# Patient Record
Sex: Male | Born: 1975 | Race: White | Hispanic: No | Marital: Married | Smoking: Former smoker
Health system: Southern US, Community
[De-identification: ages and names within clinical notes are randomized; demographics above are authoritative.]

## PROBLEM LIST (undated history)

## (undated) DIAGNOSIS — E782 Mixed hyperlipidemia: Secondary | ICD-10-CM

## (undated) DIAGNOSIS — I509 Heart failure, unspecified: Secondary | ICD-10-CM

## (undated) DIAGNOSIS — E119 Type 2 diabetes mellitus without complications: Secondary | ICD-10-CM

## (undated) DIAGNOSIS — J45909 Unspecified asthma, uncomplicated: Secondary | ICD-10-CM

## (undated) DIAGNOSIS — M1990 Unspecified osteoarthritis, unspecified site: Secondary | ICD-10-CM

## (undated) DIAGNOSIS — K219 Gastro-esophageal reflux disease without esophagitis: Secondary | ICD-10-CM

## (undated) DIAGNOSIS — G4730 Sleep apnea, unspecified: Secondary | ICD-10-CM

## (undated) DIAGNOSIS — I10 Essential (primary) hypertension: Secondary | ICD-10-CM

## (undated) HISTORY — PX: EUSTACHIAN TUBE DILATION: SHX6770

## (undated) HISTORY — PX: KNEE SURGERY: SHX244

## (undated) HISTORY — PX: REFRACTIVE SURGERY: SHX103

---

## 2000-06-14 ENCOUNTER — Other Ambulatory Visit: Admission: RE | Admit: 2000-06-14 | Discharge: 2000-06-14 | Payer: Self-pay | Admitting: Otolaryngology

## 2000-06-14 ENCOUNTER — Encounter (INDEPENDENT_AMBULATORY_CARE_PROVIDER_SITE_OTHER): Payer: Self-pay | Admitting: Specialist

## 2008-05-18 ENCOUNTER — Emergency Department (HOSPITAL_BASED_OUTPATIENT_CLINIC_OR_DEPARTMENT_OTHER): Admission: EM | Admit: 2008-05-18 | Discharge: 2008-05-19 | Payer: Self-pay | Admitting: Emergency Medicine

## 2016-10-19 ENCOUNTER — Other Ambulatory Visit (HOSPITAL_COMMUNITY): Payer: Self-pay | Admitting: Ophthalmology

## 2016-10-19 ENCOUNTER — Other Ambulatory Visit (HOSPITAL_COMMUNITY): Payer: Self-pay | Admitting: Surgery

## 2016-10-19 DIAGNOSIS — H47012 Ischemic optic neuropathy, left eye: Secondary | ICD-10-CM

## 2016-10-28 ENCOUNTER — Ambulatory Visit (HOSPITAL_COMMUNITY): Payer: BLUE CROSS/BLUE SHIELD

## 2016-11-11 ENCOUNTER — Encounter (HOSPITAL_COMMUNITY): Payer: Self-pay

## 2016-11-11 ENCOUNTER — Ambulatory Visit (HOSPITAL_COMMUNITY)
Admission: RE | Admit: 2016-11-11 | Discharge: 2016-11-11 | Disposition: A | Discharge status: Home / Self Care | Payer: BLUE CROSS/BLUE SHIELD | Source: Ambulatory Visit | Attending: Ophthalmology | Admitting: Ophthalmology

## 2016-11-11 DIAGNOSIS — H47012 Ischemic optic neuropathy, left eye: Secondary | ICD-10-CM | POA: Insufficient documentation

## 2016-11-11 LAB — CREATININE, SERUM: CREATININE: 0.78 mg/dL (ref 0.61–1.24)

## 2016-11-11 MED ORDER — GADOBENATE DIMEGLUMINE 529 MG/ML IV SOLN
20.0000 mL | Freq: Once | INTRAVENOUS | Status: AC | PRN
  Administered 2016-11-11: 20 mL via INTRAVENOUS

## 2019-10-07 DIAGNOSIS — I5022 Chronic systolic (congestive) heart failure: Secondary | ICD-10-CM | POA: Diagnosis not present

## 2019-10-07 DIAGNOSIS — I10 Essential (primary) hypertension: Secondary | ICD-10-CM | POA: Diagnosis not present

## 2019-10-07 DIAGNOSIS — I2510 Atherosclerotic heart disease of native coronary artery without angina pectoris: Secondary | ICD-10-CM | POA: Diagnosis not present

## 2019-10-07 DIAGNOSIS — Z6841 Body Mass Index (BMI) 40.0 and over, adult: Secondary | ICD-10-CM | POA: Diagnosis not present

## 2019-10-18 DIAGNOSIS — E1169 Type 2 diabetes mellitus with other specified complication: Secondary | ICD-10-CM | POA: Diagnosis not present

## 2019-10-18 DIAGNOSIS — I5020 Unspecified systolic (congestive) heart failure: Secondary | ICD-10-CM | POA: Diagnosis not present

## 2019-10-18 DIAGNOSIS — G4733 Obstructive sleep apnea (adult) (pediatric): Secondary | ICD-10-CM | POA: Diagnosis not present

## 2019-10-18 DIAGNOSIS — I428 Other cardiomyopathies: Secondary | ICD-10-CM | POA: Diagnosis not present

## 2019-11-06 DIAGNOSIS — I5022 Chronic systolic (congestive) heart failure: Secondary | ICD-10-CM | POA: Diagnosis not present

## 2019-11-15 DIAGNOSIS — E7800 Pure hypercholesterolemia, unspecified: Secondary | ICD-10-CM | POA: Diagnosis not present

## 2019-11-15 DIAGNOSIS — Z6841 Body Mass Index (BMI) 40.0 and over, adult: Secondary | ICD-10-CM | POA: Diagnosis not present

## 2019-11-15 DIAGNOSIS — I10 Essential (primary) hypertension: Secondary | ICD-10-CM | POA: Diagnosis not present

## 2019-11-15 DIAGNOSIS — E1169 Type 2 diabetes mellitus with other specified complication: Secondary | ICD-10-CM | POA: Diagnosis not present

## 2019-12-13 DIAGNOSIS — I5022 Chronic systolic (congestive) heart failure: Secondary | ICD-10-CM | POA: Diagnosis not present

## 2019-12-16 DIAGNOSIS — I5022 Chronic systolic (congestive) heart failure: Secondary | ICD-10-CM | POA: Diagnosis not present

## 2019-12-19 DIAGNOSIS — E785 Hyperlipidemia, unspecified: Secondary | ICD-10-CM | POA: Diagnosis not present

## 2019-12-19 DIAGNOSIS — I429 Cardiomyopathy, unspecified: Secondary | ICD-10-CM | POA: Diagnosis not present

## 2019-12-19 DIAGNOSIS — E1165 Type 2 diabetes mellitus with hyperglycemia: Secondary | ICD-10-CM | POA: Diagnosis not present

## 2019-12-19 DIAGNOSIS — I2510 Atherosclerotic heart disease of native coronary artery without angina pectoris: Secondary | ICD-10-CM | POA: Diagnosis not present

## 2020-02-13 DIAGNOSIS — E782 Mixed hyperlipidemia: Secondary | ICD-10-CM | POA: Diagnosis not present

## 2020-02-13 DIAGNOSIS — I2510 Atherosclerotic heart disease of native coronary artery without angina pectoris: Secondary | ICD-10-CM | POA: Diagnosis not present

## 2020-02-13 DIAGNOSIS — I5022 Chronic systolic (congestive) heart failure: Secondary | ICD-10-CM | POA: Diagnosis not present

## 2020-02-13 DIAGNOSIS — I429 Cardiomyopathy, unspecified: Secondary | ICD-10-CM | POA: Diagnosis not present

## 2020-02-13 DIAGNOSIS — E785 Hyperlipidemia, unspecified: Secondary | ICD-10-CM | POA: Diagnosis not present

## 2020-02-13 DIAGNOSIS — I10 Essential (primary) hypertension: Secondary | ICD-10-CM | POA: Diagnosis not present

## 2020-02-13 DIAGNOSIS — E1165 Type 2 diabetes mellitus with hyperglycemia: Secondary | ICD-10-CM | POA: Diagnosis not present

## 2020-03-16 DIAGNOSIS — I2510 Atherosclerotic heart disease of native coronary artery without angina pectoris: Secondary | ICD-10-CM | POA: Diagnosis not present

## 2020-03-16 DIAGNOSIS — E785 Hyperlipidemia, unspecified: Secondary | ICD-10-CM | POA: Diagnosis not present

## 2020-03-16 DIAGNOSIS — I10 Essential (primary) hypertension: Secondary | ICD-10-CM | POA: Diagnosis not present

## 2020-03-16 DIAGNOSIS — E1165 Type 2 diabetes mellitus with hyperglycemia: Secondary | ICD-10-CM | POA: Diagnosis not present

## 2020-03-16 DIAGNOSIS — I429 Cardiomyopathy, unspecified: Secondary | ICD-10-CM | POA: Diagnosis not present

## 2020-03-16 DIAGNOSIS — Z6841 Body Mass Index (BMI) 40.0 and over, adult: Secondary | ICD-10-CM | POA: Diagnosis not present

## 2020-03-16 DIAGNOSIS — I5022 Chronic systolic (congestive) heart failure: Secondary | ICD-10-CM | POA: Diagnosis not present

## 2020-04-16 DIAGNOSIS — E785 Hyperlipidemia, unspecified: Secondary | ICD-10-CM | POA: Diagnosis not present

## 2020-04-16 DIAGNOSIS — E1165 Type 2 diabetes mellitus with hyperglycemia: Secondary | ICD-10-CM | POA: Diagnosis not present

## 2020-04-16 DIAGNOSIS — I2510 Atherosclerotic heart disease of native coronary artery without angina pectoris: Secondary | ICD-10-CM | POA: Diagnosis not present

## 2020-04-16 DIAGNOSIS — I10 Essential (primary) hypertension: Secondary | ICD-10-CM | POA: Diagnosis not present

## 2020-04-16 DIAGNOSIS — I429 Cardiomyopathy, unspecified: Secondary | ICD-10-CM | POA: Diagnosis not present

## 2020-04-16 DIAGNOSIS — I5022 Chronic systolic (congestive) heart failure: Secondary | ICD-10-CM | POA: Diagnosis not present

## 2020-06-12 DIAGNOSIS — E1165 Type 2 diabetes mellitus with hyperglycemia: Secondary | ICD-10-CM | POA: Diagnosis not present

## 2020-06-12 DIAGNOSIS — E785 Hyperlipidemia, unspecified: Secondary | ICD-10-CM | POA: Diagnosis not present

## 2020-06-18 DIAGNOSIS — I2510 Atherosclerotic heart disease of native coronary artery without angina pectoris: Secondary | ICD-10-CM | POA: Diagnosis not present

## 2020-06-18 DIAGNOSIS — I429 Cardiomyopathy, unspecified: Secondary | ICD-10-CM | POA: Diagnosis not present

## 2020-06-18 DIAGNOSIS — E1165 Type 2 diabetes mellitus with hyperglycemia: Secondary | ICD-10-CM | POA: Diagnosis not present

## 2020-06-18 DIAGNOSIS — E785 Hyperlipidemia, unspecified: Secondary | ICD-10-CM | POA: Diagnosis not present

## 2020-10-22 DIAGNOSIS — E1165 Type 2 diabetes mellitus with hyperglycemia: Secondary | ICD-10-CM | POA: Diagnosis not present

## 2020-10-29 DIAGNOSIS — I5022 Chronic systolic (congestive) heart failure: Secondary | ICD-10-CM | POA: Diagnosis not present

## 2020-10-29 DIAGNOSIS — E785 Hyperlipidemia, unspecified: Secondary | ICD-10-CM | POA: Diagnosis not present

## 2020-10-29 DIAGNOSIS — I2510 Atherosclerotic heart disease of native coronary artery without angina pectoris: Secondary | ICD-10-CM | POA: Diagnosis not present

## 2020-10-29 DIAGNOSIS — I10 Essential (primary) hypertension: Secondary | ICD-10-CM | POA: Diagnosis not present

## 2020-10-29 DIAGNOSIS — E1165 Type 2 diabetes mellitus with hyperglycemia: Secondary | ICD-10-CM | POA: Diagnosis not present

## 2020-10-29 DIAGNOSIS — I429 Cardiomyopathy, unspecified: Secondary | ICD-10-CM | POA: Diagnosis not present

## 2020-12-25 DIAGNOSIS — M7711 Lateral epicondylitis, right elbow: Secondary | ICD-10-CM | POA: Diagnosis not present

## 2020-12-25 DIAGNOSIS — Z6841 Body Mass Index (BMI) 40.0 and over, adult: Secondary | ICD-10-CM | POA: Diagnosis not present

## 2021-02-17 DIAGNOSIS — E1165 Type 2 diabetes mellitus with hyperglycemia: Secondary | ICD-10-CM | POA: Diagnosis not present

## 2021-02-23 DIAGNOSIS — E1165 Type 2 diabetes mellitus with hyperglycemia: Secondary | ICD-10-CM | POA: Diagnosis not present

## 2021-02-23 DIAGNOSIS — I2510 Atherosclerotic heart disease of native coronary artery without angina pectoris: Secondary | ICD-10-CM | POA: Diagnosis not present

## 2021-02-23 DIAGNOSIS — I429 Cardiomyopathy, unspecified: Secondary | ICD-10-CM | POA: Diagnosis not present

## 2021-02-23 DIAGNOSIS — E785 Hyperlipidemia, unspecified: Secondary | ICD-10-CM | POA: Diagnosis not present

## 2021-06-03 DIAGNOSIS — E119 Type 2 diabetes mellitus without complications: Secondary | ICD-10-CM | POA: Diagnosis not present

## 2021-06-03 DIAGNOSIS — H40033 Anatomical narrow angle, bilateral: Secondary | ICD-10-CM | POA: Diagnosis not present

## 2021-06-09 DIAGNOSIS — G4733 Obstructive sleep apnea (adult) (pediatric): Secondary | ICD-10-CM | POA: Diagnosis not present

## 2021-08-02 ENCOUNTER — Other Ambulatory Visit: Payer: Self-pay

## 2021-08-02 ENCOUNTER — Emergency Department (HOSPITAL_BASED_OUTPATIENT_CLINIC_OR_DEPARTMENT_OTHER)
Admission: EM | Admit: 2021-08-02 | Discharge: 2021-08-02 | Disposition: A | Discharge status: Home / Self Care | Payer: BC Managed Care – PPO | Attending: Emergency Medicine | Admitting: Emergency Medicine

## 2021-08-02 ENCOUNTER — Emergency Department (HOSPITAL_BASED_OUTPATIENT_CLINIC_OR_DEPARTMENT_OTHER): Payer: BC Managed Care – PPO

## 2021-08-02 ENCOUNTER — Encounter (HOSPITAL_BASED_OUTPATIENT_CLINIC_OR_DEPARTMENT_OTHER): Payer: Self-pay | Admitting: Emergency Medicine

## 2021-08-02 DIAGNOSIS — F17210 Nicotine dependence, cigarettes, uncomplicated: Secondary | ICD-10-CM | POA: Insufficient documentation

## 2021-08-02 DIAGNOSIS — R0602 Shortness of breath: Secondary | ICD-10-CM | POA: Diagnosis not present

## 2021-08-02 DIAGNOSIS — R059 Cough, unspecified: Secondary | ICD-10-CM | POA: Diagnosis not present

## 2021-08-02 DIAGNOSIS — J45909 Unspecified asthma, uncomplicated: Secondary | ICD-10-CM | POA: Insufficient documentation

## 2021-08-02 DIAGNOSIS — Z20822 Contact with and (suspected) exposure to covid-19: Secondary | ICD-10-CM | POA: Diagnosis not present

## 2021-08-02 DIAGNOSIS — I517 Cardiomegaly: Secondary | ICD-10-CM | POA: Diagnosis not present

## 2021-08-02 DIAGNOSIS — I509 Heart failure, unspecified: Secondary | ICD-10-CM | POA: Diagnosis not present

## 2021-08-02 DIAGNOSIS — I110 Hypertensive heart disease with heart failure: Secondary | ICD-10-CM | POA: Diagnosis not present

## 2021-08-02 DIAGNOSIS — R600 Localized edema: Secondary | ICD-10-CM | POA: Diagnosis not present

## 2021-08-02 DIAGNOSIS — E119 Type 2 diabetes mellitus without complications: Secondary | ICD-10-CM | POA: Insufficient documentation

## 2021-08-02 DIAGNOSIS — J9811 Atelectasis: Secondary | ICD-10-CM | POA: Diagnosis not present

## 2021-08-02 DIAGNOSIS — R079 Chest pain, unspecified: Secondary | ICD-10-CM | POA: Diagnosis not present

## 2021-08-02 HISTORY — DX: Type 2 diabetes mellitus without complications: E11.9

## 2021-08-02 HISTORY — DX: Heart failure, unspecified: I50.9

## 2021-08-02 HISTORY — DX: Mixed hyperlipidemia: E78.2

## 2021-08-02 HISTORY — DX: Unspecified asthma, uncomplicated: J45.909

## 2021-08-02 HISTORY — DX: Unspecified osteoarthritis, unspecified site: M19.90

## 2021-08-02 HISTORY — DX: Essential (primary) hypertension: I10

## 2021-08-02 LAB — COMPREHENSIVE METABOLIC PANEL
ALT: 33 U/L (ref 0–44)
AST: 14 U/L — ABNORMAL LOW (ref 15–41)
Albumin: 4.5 g/dL (ref 3.5–5.0)
Alkaline Phosphatase: 93 U/L (ref 38–126)
Anion gap: 13 (ref 5–15)
BUN: 11 mg/dL (ref 6–20)
CO2: 24 mmol/L (ref 22–32)
Calcium: 9.2 mg/dL (ref 8.9–10.3)
Chloride: 101 mmol/L (ref 98–111)
Creatinine, Ser: 0.69 mg/dL (ref 0.61–1.24)
GFR, Estimated: >60 mL/min (ref >60)
Glucose, Bld: 264 mg/dL — ABNORMAL HIGH (ref 70–99)
Potassium: 3.5 mmol/L (ref 3.5–5.1)
Sodium: 138 mmol/L (ref 135–145)
Total Bilirubin: 0.8 mg/dL (ref 0.3–1.2)
Total Protein: 7.3 g/dL (ref 6.5–8.1)

## 2021-08-02 LAB — TROPONIN I (HIGH SENSITIVITY)
Troponin I (High Sensitivity): 23 ng/L — ABNORMAL HIGH (ref <18)
Troponin I (High Sensitivity): 23 ng/L — ABNORMAL HIGH (ref <18)

## 2021-08-02 LAB — CBC WITH DIFFERENTIAL/PLATELET
Abs Immature Granulocytes: 0.03 10*3/uL (ref 0.00–0.07)
Basophils Absolute: 0.1 10*3/uL (ref 0.0–0.1)
Basophils Relative: 1 %
Eosinophils Absolute: 0.3 10*3/uL (ref 0.0–0.5)
Eosinophils Relative: 3 %
HCT: 47.2 % (ref 39.0–52.0)
Hemoglobin: 16.8 g/dL (ref 13.0–17.0)
Immature Granulocytes: 0 %
Lymphocytes Relative: 23 %
Lymphs Abs: 1.8 10*3/uL (ref 0.7–4.0)
MCH: 29.9 pg (ref 26.0–34.0)
MCHC: 35.6 g/dL (ref 30.0–36.0)
MCV: 84.1 fL (ref 80.0–100.0)
Monocytes Absolute: 0.5 10*3/uL (ref 0.1–1.0)
Monocytes Relative: 7 %
Neutro Abs: 5 10*3/uL (ref 1.7–7.7)
Neutrophils Relative %: 66 %
Platelets: 283 10*3/uL (ref 150–400)
RBC: 5.61 MIL/uL (ref 4.22–5.81)
RDW: 13 % (ref 11.5–15.5)
WBC: 7.6 10*3/uL (ref 4.0–10.5)
nRBC: 0 % (ref 0.0–0.2)

## 2021-08-02 LAB — RESP PANEL BY RT-PCR (FLU A&B, COVID) ARPGX2
Influenza A by PCR: NEGATIVE
Influenza B by PCR: NEGATIVE
SARS Coronavirus 2 by RT PCR: NEGATIVE

## 2021-08-02 LAB — BRAIN NATRIURETIC PEPTIDE: B Natriuretic Peptide: 338.2 pg/mL — ABNORMAL HIGH (ref 0.0–100.0)

## 2021-08-02 MED ORDER — ALBUTEROL SULFATE HFA 108 (90 BASE) MCG/ACT IN AERS
1.0000 | INHALATION_SPRAY | Freq: Once | RESPIRATORY_TRACT | Status: AC
  Administered 2021-08-02: 1 via RESPIRATORY_TRACT
  Filled 2021-08-02: qty 6.7

## 2021-08-02 MED ORDER — FUROSEMIDE 10 MG/ML IJ SOLN
40.0000 mg | Freq: Once | INTRAMUSCULAR | Status: AC
  Administered 2021-08-02: 40 mg via INTRAVENOUS
  Filled 2021-08-02: qty 4

## 2021-08-02 NOTE — ED Notes (Signed)
X-ray at bedside

## 2021-08-02 NOTE — ED Provider Notes (Signed)
[facility] DEPT Provider Note   CSN: [id number] Arrival date & time: 08/02/21  [id number]     History Chief Complaint  Patient presents with   Shortness of Breath    Richard Doyle is a 45 y.o. male.  History of heart failure here with cough and shortness of breath.  Has been noncompliant with his medications.  Denies any chest pain or fever or chills.  The history is provided by the patient.  Shortness of Breath Severity:  Mild Onset quality:  Gradual Duration:  3 days Timing:  Intermittent Progression:  Waxing and waning Chronicity:  New Context: URI   Relieved by:  Nothing Worsened by:  Nothing Associated symptoms: cough   Associated symptoms: no abdominal pain, no chest pain, no claudication, no diaphoresis, no ear pain, no fever, no headaches, no rash, no sore throat, no sputum production, no syncope, no swollen glands and no vomiting       Past Medical History:  Diagnosis Date   Arthritis    Asthma    Congestive heart failure (CHF) ([place])    Diabetes mellitus without complication ([place])    Hypertension    Mixed hyperlipidemia     There are no problems to display for this patient.       History reviewed. No pertinent family history.  Social History   Tobacco Use   Smoking status: Some Days    Types: Cigarettes   Smokeless tobacco: Current    Home Medications Prior to Admission medications   Not on File    Allergies    Patient has no known allergies.  Review of Systems   Review of Systems  Constitutional:  Negative for chills, diaphoresis and fever.  HENT:  Negative for ear pain and sore throat.   Eyes:  Negative for pain and visual disturbance.  Respiratory:  Positive for cough and shortness of breath. Negative for sputum production.   Cardiovascular:  Positive for leg swelling. Negative for chest pain, palpitations, claudication and syncope.  Gastrointestinal:  Negative for abdominal pain and vomiting.   Genitourinary:  Negative for dysuria and hematuria.  Musculoskeletal:  Negative for arthralgias and back pain.  Skin:  Negative for color change and rash.  Neurological:  Negative for seizures, syncope and headaches.  All other systems reviewed and are negative.  Physical Exam Updated Vital Signs BP (!) 157/104   Pulse 81   Temp 97.9 F (36.6 C) (Oral)   Resp 15   Ht 6\' 2"  (1.88 m)   Wt (!) 142.9 kg   SpO2 95%   BMI 40.44 kg/m   Physical Exam Vitals and nursing note reviewed.  Constitutional:      General: He is not in acute distress.    Appearance: He is well-developed.  HENT:     Head: Normocephalic and atraumatic.     Mouth/Throat:     Mouth: Mucous membranes are moist.  Eyes:     Conjunctiva/sclera: Conjunctivae normal.  Cardiovascular:     Rate and Rhythm: Normal rate and regular rhythm.     Pulses: Normal pulses.     Heart sounds: No murmur heard. Pulmonary:     Effort: Pulmonary effort is normal. No respiratory distress.     Breath sounds: Normal breath sounds. No decreased breath sounds.  Abdominal:     Palpations: Abdomen is soft.     Tenderness: There is no abdominal tenderness.  Musculoskeletal:        General: No swelling.     Cervical back:  Normal range of motion and neck supple.     Right lower leg: Edema (trace) present.     Left lower leg: Edema (trace) present.  Skin:    General: Skin is warm and dry.     Capillary Refill: Capillary refill takes less than 2 seconds.  Neurological:     General: No focal deficit present.     Mental Status: He is alert.  Psychiatric:        Mood and Affect: Mood normal.    ED Results / Procedures / Treatments   Labs (all labs ordered are listed, but only abnormal results are displayed) Labs Reviewed  COMPREHENSIVE METABOLIC PANEL - Abnormal; Notable for the following components:      Result Value   Glucose, Bld 264 (*)    AST 14 (*)    All other components within normal limits  BRAIN NATRIURETIC PEPTIDE  - Abnormal; Notable for the following components:   B Natriuretic Peptide 338.2 (*)    All other components within normal limits  TROPONIN I (HIGH SENSITIVITY) - Abnormal; Notable for the following components:   Troponin I (High Sensitivity) 23 (*)    All other components within normal limits  TROPONIN I (HIGH SENSITIVITY) - Abnormal; Notable for the following components:   Troponin I (High Sensitivity) 23 (*)    All other components within normal limits  RESP PANEL BY RT-PCR (FLU A&B, COVID) [id number]  CBC WITH DIFFERENTIAL/PLATELET    EKG EKG Interpretation  Date/Time:  Monday August 02 2021 08:13:03 EST Ventricular Rate:  81 PR Interval:  202 QRS Duration: 126 QT Interval:  427 QTC Calculation: 496 R Axis:   -20 Text Interpretation: Sinus rhythm Borderline prolonged PR interval Left bundle branch block Baseline wander in lead(s) V1 Confirmed by Lennice Sites ([id number]) on 08/02/2021 8:22:45 AM  Radiology DG Chest Portable 1 View  Result Date: 08/02/2021 CLINICAL DATA:  Shortness of breath, chest pain, cough EXAM: PORTABLE CHEST 1 VIEW COMPARISON:  Portable exam 0832 hours without priors for comparison FINDINGS: Enlargement of cardiac silhouette. Mediastinal contours and pulmonary vascularity normal. Mild RIGHT basilar atelectasis. Questionable retrocardiac LEFT lower lobe infiltrate. Remaining lungs clear. No pleural effusion or pneumothorax. IMPRESSION: Enlargement of cardiac silhouette with RIGHT basilar atelectasis. Questionable retrocardiac LEFT lower lobe infiltrate. Electronically Signed   By: Lavonia Dana M.D.   On: 08/02/2021 08:39    Procedures Procedures   Medications Ordered in ED Medications  furosemide (LASIX) injection 40 mg (40 mg Intravenous Given 08/02/21 [id number])    ED Course  I have reviewed the triage vital signs and the nursing notes.  Pertinent labs & imaging results that were available during my care of the patient were reviewed by me and considered in my  medical decision making (see chart for details).    MDM Rules/Calculators/A&P                           Richard Doyle is here with shortness of breath.  History of heart failure, diabetes, high blood pressure.  Patient with high blood pressure but otherwise normal vitals.  Has been noncompliant with his medications.  Has some mild swelling in his legs.  Overall no respiratory distress and clear breath sounds.  Has had a dry cough.  Suspect volume overload versus viral process.  Seems less likely to be ACS is not having any chest pain.  EKG shows sinus rhythm.  No ischemic changes.  Will check labs to evaluate  for heart failure versus infectious process versus ACS.  BNP mildly elevated but appears to be around his baseline.  Troponin 23x2.  EKG with no ischemic changes.  No chest pain.  No infectious symptoms.  Chest x-ray more consistent with volume overload than infectious process.  No leukocytosis.  COVID and flu test are negative.  Doubt pneumonia.  Overall suspect mild heart failure exacerbation.  He is actually written for Lasix to use as needed.  Was given a dose of IV Lasix here.  We will have him continue home dose for the next 4 to 5 days and have him follow-up with cardiologist.  Discharged in good condition, no respiratory distress.  This chart was dictated using voice recognition software.  Despite best efforts to proofread,  errors can occur which can change the documentation meaning.   Final Clinical Impression(s) / ED Diagnoses Final diagnoses:  Acute heart failure, unspecified heart failure type [facility])    Rx / DC Orders ED Discharge Orders     None        Virgina Norfolk, DO 08/02/21 1028

## 2021-08-02 NOTE — ED Notes (Signed)
ED Provider at bedside. 

## 2021-08-02 NOTE — ED Triage Notes (Signed)
Pt arrives to ED with c/o shortness of breath. This started x3 days ago. Pt reports that he started coughing a couple days before the SOB started. He has hx of CHF and Asthma. Does report that he has been incompliant with medications recently. Unsure if he has gained weight. Associated symptoms include DOE, orthopnea, dry cough, chest pressure.

## 2021-08-02 NOTE — Discharge Instructions (Signed)
Take 40 mg of furosemide/Lasix daily for the next 4 to 5 days and follow-up with  cardiologist.

## 2021-09-13 DIAGNOSIS — R42 Dizziness and giddiness: Secondary | ICD-10-CM | POA: Diagnosis not present

## 2021-09-13 DIAGNOSIS — H6690 Otitis media, unspecified, unspecified ear: Secondary | ICD-10-CM | POA: Diagnosis not present

## 2021-09-30 DIAGNOSIS — Z8669 Personal history of other diseases of the nervous system and sense organs: Secondary | ICD-10-CM | POA: Diagnosis not present

## 2021-09-30 DIAGNOSIS — H9313 Tinnitus, bilateral: Secondary | ICD-10-CM | POA: Diagnosis not present

## 2021-10-01 DIAGNOSIS — H90A32 Mixed conductive and sensorineural hearing loss, unilateral, left ear with restricted hearing on the contralateral side: Secondary | ICD-10-CM | POA: Diagnosis not present

## 2021-10-08 DIAGNOSIS — H90A32 Mixed conductive and sensorineural hearing loss, unilateral, left ear with restricted hearing on the contralateral side: Secondary | ICD-10-CM | POA: Diagnosis not present

## 2021-10-12 ENCOUNTER — Other Ambulatory Visit: Payer: Self-pay

## 2021-10-12 DIAGNOSIS — Z79899 Other long term (current) drug therapy: Secondary | ICD-10-CM | POA: Diagnosis not present

## 2021-10-12 DIAGNOSIS — Z8669 Personal history of other diseases of the nervous system and sense organs: Secondary | ICD-10-CM | POA: Diagnosis not present

## 2021-10-12 DIAGNOSIS — H906 Mixed conductive and sensorineural hearing loss, bilateral: Secondary | ICD-10-CM | POA: Diagnosis not present

## 2021-10-12 DIAGNOSIS — I5042 Chronic combined systolic (congestive) and diastolic (congestive) heart failure: Secondary | ICD-10-CM | POA: Diagnosis not present

## 2021-10-12 DIAGNOSIS — N281 Cyst of kidney, acquired: Secondary | ICD-10-CM | POA: Diagnosis not present

## 2021-10-12 DIAGNOSIS — Z7984 Long term (current) use of oral hypoglycemic drugs: Secondary | ICD-10-CM

## 2021-10-12 DIAGNOSIS — I2510 Atherosclerotic heart disease of native coronary artery without angina pectoris: Secondary | ICD-10-CM | POA: Diagnosis present

## 2021-10-12 DIAGNOSIS — R748 Abnormal levels of other serum enzymes: Secondary | ICD-10-CM | POA: Diagnosis not present

## 2021-10-12 DIAGNOSIS — Z20822 Contact with and (suspected) exposure to covid-19: Secondary | ICD-10-CM | POA: Diagnosis not present

## 2021-10-12 DIAGNOSIS — G4733 Obstructive sleep apnea (adult) (pediatric): Secondary | ICD-10-CM | POA: Diagnosis present

## 2021-10-12 DIAGNOSIS — E782 Mixed hyperlipidemia: Secondary | ICD-10-CM | POA: Diagnosis present

## 2021-10-12 DIAGNOSIS — K811 Chronic cholecystitis: Secondary | ICD-10-CM | POA: Diagnosis present

## 2021-10-12 DIAGNOSIS — R1011 Right upper quadrant pain: Secondary | ICD-10-CM | POA: Diagnosis present

## 2021-10-12 DIAGNOSIS — M1990 Unspecified osteoarthritis, unspecified site: Secondary | ICD-10-CM | POA: Diagnosis present

## 2021-10-12 DIAGNOSIS — Z794 Long term (current) use of insulin: Secondary | ICD-10-CM | POA: Diagnosis not present

## 2021-10-12 DIAGNOSIS — Z9622 Myringotomy tube(s) status: Secondary | ICD-10-CM | POA: Diagnosis not present

## 2021-10-12 DIAGNOSIS — K810 Acute cholecystitis: Secondary | ICD-10-CM | POA: Diagnosis not present

## 2021-10-12 DIAGNOSIS — H6983 Other specified disorders of Eustachian tube, bilateral: Secondary | ICD-10-CM | POA: Diagnosis not present

## 2021-10-12 DIAGNOSIS — J45909 Unspecified asthma, uncomplicated: Secondary | ICD-10-CM | POA: Diagnosis not present

## 2021-10-12 DIAGNOSIS — F17210 Nicotine dependence, cigarettes, uncomplicated: Secondary | ICD-10-CM | POA: Diagnosis not present

## 2021-10-12 DIAGNOSIS — K7689 Other specified diseases of liver: Secondary | ICD-10-CM | POA: Diagnosis not present

## 2021-10-12 DIAGNOSIS — K760 Fatty (change of) liver, not elsewhere classified: Secondary | ICD-10-CM | POA: Diagnosis present

## 2021-10-12 DIAGNOSIS — R945 Abnormal results of liver function studies: Secondary | ICD-10-CM | POA: Diagnosis not present

## 2021-10-12 DIAGNOSIS — I700 Atherosclerosis of aorta: Secondary | ICD-10-CM | POA: Diagnosis not present

## 2021-10-12 DIAGNOSIS — R1013 Epigastric pain: Secondary | ICD-10-CM | POA: Diagnosis not present

## 2021-10-12 DIAGNOSIS — I110 Hypertensive heart disease with heart failure: Secondary | ICD-10-CM | POA: Diagnosis not present

## 2021-10-12 DIAGNOSIS — K828 Other specified diseases of gallbladder: Secondary | ICD-10-CM | POA: Diagnosis present

## 2021-10-12 DIAGNOSIS — E119 Type 2 diabetes mellitus without complications: Secondary | ICD-10-CM | POA: Diagnosis not present

## 2021-10-12 DIAGNOSIS — E876 Hypokalemia: Secondary | ICD-10-CM | POA: Diagnosis not present

## 2021-10-12 DIAGNOSIS — R197 Diarrhea, unspecified: Secondary | ICD-10-CM | POA: Diagnosis present

## 2021-10-12 DIAGNOSIS — R7401 Elevation of levels of liver transaminase levels: Secondary | ICD-10-CM | POA: Diagnosis not present

## 2021-10-12 DIAGNOSIS — H748X1 Other specified disorders of right middle ear and mastoid: Secondary | ICD-10-CM | POA: Diagnosis not present

## 2021-10-12 DIAGNOSIS — R17 Unspecified jaundice: Secondary | ICD-10-CM | POA: Diagnosis not present

## 2021-10-12 DIAGNOSIS — K838 Other specified diseases of biliary tract: Secondary | ICD-10-CM | POA: Diagnosis not present

## 2021-10-12 DIAGNOSIS — R933 Abnormal findings on diagnostic imaging of other parts of digestive tract: Secondary | ICD-10-CM | POA: Diagnosis not present

## 2021-10-12 DIAGNOSIS — R109 Unspecified abdominal pain: Secondary | ICD-10-CM | POA: Diagnosis not present

## 2021-10-12 NOTE — ED Triage Notes (Signed)
Upper abdominal pain radiates to chest starting approximately 2 hours prior to arrival.

## 2021-10-13 ENCOUNTER — Emergency Department (HOSPITAL_BASED_OUTPATIENT_CLINIC_OR_DEPARTMENT_OTHER): Payer: BC Managed Care – PPO

## 2021-10-13 ENCOUNTER — Inpatient Hospital Stay (HOSPITAL_BASED_OUTPATIENT_CLINIC_OR_DEPARTMENT_OTHER)
Admission: EM | Admit: 2021-10-13 | Discharge: 2021-10-16 | DRG: 445 | Disposition: A | Discharge status: Home / Self Care | Payer: BC Managed Care – PPO | Attending: Family Medicine | Admitting: Family Medicine

## 2021-10-13 ENCOUNTER — Observation Stay (HOSPITAL_COMMUNITY): Payer: BC Managed Care – PPO

## 2021-10-13 ENCOUNTER — Encounter (HOSPITAL_BASED_OUTPATIENT_CLINIC_OR_DEPARTMENT_OTHER): Payer: Self-pay

## 2021-10-13 ENCOUNTER — Other Ambulatory Visit: Payer: Self-pay

## 2021-10-13 DIAGNOSIS — Z79899 Other long term (current) drug therapy: Secondary | ICD-10-CM | POA: Diagnosis not present

## 2021-10-13 DIAGNOSIS — R17 Unspecified jaundice: Secondary | ICD-10-CM | POA: Diagnosis not present

## 2021-10-13 DIAGNOSIS — I10 Essential (primary) hypertension: Secondary | ICD-10-CM

## 2021-10-13 DIAGNOSIS — R7401 Elevation of levels of liver transaminase levels: Secondary | ICD-10-CM | POA: Diagnosis not present

## 2021-10-13 DIAGNOSIS — I700 Atherosclerosis of aorta: Secondary | ICD-10-CM | POA: Diagnosis not present

## 2021-10-13 DIAGNOSIS — I2510 Atherosclerotic heart disease of native coronary artery without angina pectoris: Secondary | ICD-10-CM | POA: Diagnosis not present

## 2021-10-13 DIAGNOSIS — E119 Type 2 diabetes mellitus without complications: Secondary | ICD-10-CM | POA: Diagnosis not present

## 2021-10-13 DIAGNOSIS — K760 Fatty (change of) liver, not elsewhere classified: Secondary | ICD-10-CM

## 2021-10-13 DIAGNOSIS — Z7984 Long term (current) use of oral hypoglycemic drugs: Secondary | ICD-10-CM | POA: Diagnosis not present

## 2021-10-13 DIAGNOSIS — R7989 Other specified abnormal findings of blood chemistry: Secondary | ICD-10-CM

## 2021-10-13 DIAGNOSIS — Z794 Long term (current) use of insulin: Secondary | ICD-10-CM | POA: Diagnosis not present

## 2021-10-13 DIAGNOSIS — J45909 Unspecified asthma, uncomplicated: Secondary | ICD-10-CM | POA: Diagnosis not present

## 2021-10-13 DIAGNOSIS — E876 Hypokalemia: Secondary | ICD-10-CM | POA: Diagnosis not present

## 2021-10-13 DIAGNOSIS — Z20822 Contact with and (suspected) exposure to covid-19: Secondary | ICD-10-CM | POA: Diagnosis not present

## 2021-10-13 DIAGNOSIS — R1011 Right upper quadrant pain: Secondary | ICD-10-CM

## 2021-10-13 DIAGNOSIS — E66813 Obesity, class 3: Secondary | ICD-10-CM | POA: Diagnosis present

## 2021-10-13 DIAGNOSIS — K810 Acute cholecystitis: Secondary | ICD-10-CM | POA: Diagnosis not present

## 2021-10-13 DIAGNOSIS — F17210 Nicotine dependence, cigarettes, uncomplicated: Secondary | ICD-10-CM | POA: Diagnosis not present

## 2021-10-13 DIAGNOSIS — K811 Chronic cholecystitis: Secondary | ICD-10-CM | POA: Diagnosis not present

## 2021-10-13 DIAGNOSIS — R197 Diarrhea, unspecified: Secondary | ICD-10-CM | POA: Diagnosis not present

## 2021-10-13 DIAGNOSIS — K7689 Other specified diseases of liver: Secondary | ICD-10-CM | POA: Diagnosis not present

## 2021-10-13 DIAGNOSIS — G4733 Obstructive sleep apnea (adult) (pediatric): Secondary | ICD-10-CM | POA: Diagnosis not present

## 2021-10-13 DIAGNOSIS — E782 Mixed hyperlipidemia: Secondary | ICD-10-CM | POA: Diagnosis not present

## 2021-10-13 DIAGNOSIS — R1013 Epigastric pain: Secondary | ICD-10-CM

## 2021-10-13 DIAGNOSIS — N281 Cyst of kidney, acquired: Secondary | ICD-10-CM | POA: Diagnosis not present

## 2021-10-13 DIAGNOSIS — R109 Unspecified abdominal pain: Secondary | ICD-10-CM | POA: Diagnosis not present

## 2021-10-13 DIAGNOSIS — M1990 Unspecified osteoarthritis, unspecified site: Secondary | ICD-10-CM | POA: Diagnosis not present

## 2021-10-13 DIAGNOSIS — K838 Other specified diseases of biliary tract: Secondary | ICD-10-CM | POA: Diagnosis not present

## 2021-10-13 DIAGNOSIS — K828 Other specified diseases of gallbladder: Secondary | ICD-10-CM | POA: Diagnosis not present

## 2021-10-13 DIAGNOSIS — I110 Hypertensive heart disease with heart failure: Secondary | ICD-10-CM | POA: Diagnosis not present

## 2021-10-13 DIAGNOSIS — I5042 Chronic combined systolic (congestive) and diastolic (congestive) heart failure: Secondary | ICD-10-CM | POA: Diagnosis present

## 2021-10-13 LAB — CBC
HCT: 45.7 % (ref 39.0–52.0)
Hemoglobin: 15.4 g/dL (ref 13.0–17.0)
MCH: 29.3 pg (ref 26.0–34.0)
MCHC: 33.7 g/dL (ref 30.0–36.0)
MCV: 86.9 fL (ref 80.0–100.0)
Platelets: 289 10*3/uL (ref 150–400)
RBC: 5.26 MIL/uL (ref 4.22–5.81)
RDW: 13.9 % (ref 11.5–15.5)
WBC: 6.7 10*3/uL (ref 4.0–10.5)
nRBC: 0 % (ref 0.0–0.2)

## 2021-10-13 LAB — COMPREHENSIVE METABOLIC PANEL
ALT: 497 U/L — ABNORMAL HIGH (ref 0–44)
ALT: 98 U/L — ABNORMAL HIGH (ref 0–44)
AST: 137 U/L — ABNORMAL HIGH (ref 15–41)
AST: 491 U/L — ABNORMAL HIGH (ref 15–41)
Albumin: 3.8 g/dL (ref 3.5–5.0)
Albumin: 4 g/dL (ref 3.5–5.0)
Alkaline Phosphatase: 144 U/L — ABNORMAL HIGH (ref 38–126)
Alkaline Phosphatase: 219 U/L — ABNORMAL HIGH (ref 38–126)
Anion gap: 12 (ref 5–15)
Anion gap: 9 (ref 5–15)
BUN: 16 mg/dL (ref 6–20)
BUN: 17 mg/dL (ref 6–20)
CO2: 25 mmol/L (ref 22–32)
CO2: 26 mmol/L (ref 22–32)
Calcium: 8.3 mg/dL — ABNORMAL LOW (ref 8.9–10.3)
Calcium: 9.3 mg/dL (ref 8.9–10.3)
Chloride: 100 mmol/L (ref 98–111)
Chloride: 100 mmol/L (ref 98–111)
Creatinine, Ser: 0.64 mg/dL (ref 0.61–1.24)
Creatinine, Ser: 0.78 mg/dL (ref 0.61–1.24)
GFR, Estimated: >60 mL/min (ref >60)
GFR, Estimated: >60 mL/min (ref >60)
Glucose, Bld: 249 mg/dL — ABNORMAL HIGH (ref 70–99)
Glucose, Bld: 265 mg/dL — ABNORMAL HIGH (ref 70–99)
Potassium: 3.7 mmol/L (ref 3.5–5.1)
Potassium: 3.8 mmol/L (ref 3.5–5.1)
Sodium: 134 mmol/L — ABNORMAL LOW (ref 135–145)
Sodium: 138 mmol/L (ref 135–145)
Total Bilirubin: 1.3 mg/dL — ABNORMAL HIGH (ref 0.3–1.2)
Total Bilirubin: 2.3 mg/dL — ABNORMAL HIGH (ref 0.3–1.2)
Total Protein: 6.2 g/dL — ABNORMAL LOW (ref 6.5–8.1)
Total Protein: 7.1 g/dL (ref 6.5–8.1)

## 2021-10-13 LAB — CBC WITH DIFFERENTIAL/PLATELET
Abs Immature Granulocytes: 0.07 10*3/uL (ref 0.00–0.07)
Basophils Absolute: 0.1 10*3/uL (ref 0.0–0.1)
Basophils Relative: 1 %
Eosinophils Absolute: 0.2 10*3/uL (ref 0.0–0.5)
Eosinophils Relative: 1 %
HCT: 47.4 % (ref 39.0–52.0)
Hemoglobin: 16.2 g/dL (ref 13.0–17.0)
Immature Granulocytes: 1 %
Lymphocytes Relative: 19 %
Lymphs Abs: 2.1 10*3/uL (ref 0.7–4.0)
MCH: 29.2 pg (ref 26.0–34.0)
MCHC: 34.2 g/dL (ref 30.0–36.0)
MCV: 85.6 fL (ref 80.0–100.0)
Monocytes Absolute: 0.9 10*3/uL (ref 0.1–1.0)
Monocytes Relative: 8 %
Neutro Abs: 7.7 10*3/uL (ref 1.7–7.7)
Neutrophils Relative %: 70 %
Platelets: 319 10*3/uL (ref 150–400)
RBC: 5.54 MIL/uL (ref 4.22–5.81)
RDW: 13.8 % (ref 11.5–15.5)
WBC: 11 10*3/uL — ABNORMAL HIGH (ref 4.0–10.5)
nRBC: 0 % (ref 0.0–0.2)

## 2021-10-13 LAB — URINALYSIS, ROUTINE W REFLEX MICROSCOPIC
Bilirubin Urine: NEGATIVE
Glucose, UA: >1000 mg/dL — AB
Hgb urine dipstick: NEGATIVE
Ketones, ur: NEGATIVE mg/dL
Leukocytes,Ua: NEGATIVE
Nitrite: NEGATIVE
Specific Gravity, Urine: 1.046 — ABNORMAL HIGH (ref 1.005–1.030)
pH: 7 (ref 5.0–8.0)

## 2021-10-13 LAB — RESP PANEL BY RT-PCR (FLU A&B, COVID) ARPGX2
Influenza A by PCR: NEGATIVE
Influenza B by PCR: NEGATIVE
SARS Coronavirus 2 by RT PCR: NEGATIVE

## 2021-10-13 LAB — CBG MONITORING, ED
Glucose-Capillary: 271 mg/dL — ABNORMAL HIGH (ref 70–99)
Glucose-Capillary: 277 mg/dL — ABNORMAL HIGH (ref 70–99)

## 2021-10-13 LAB — MAGNESIUM: Magnesium: 1.8 mg/dL (ref 1.7–2.4)

## 2021-10-13 LAB — HEPATITIS PANEL, ACUTE
HCV Ab: NONREACTIVE
Hep A IgM: NONREACTIVE
Hep B C IgM: NONREACTIVE
Hepatitis B Surface Ag: NONREACTIVE

## 2021-10-13 LAB — TROPONIN I (HIGH SENSITIVITY): Troponin I (High Sensitivity): 16 ng/L (ref <18)

## 2021-10-13 LAB — GLUCOSE, CAPILLARY
Glucose-Capillary: 214 mg/dL — ABNORMAL HIGH (ref 70–99)
Glucose-Capillary: 221 mg/dL — ABNORMAL HIGH (ref 70–99)
Glucose-Capillary: 258 mg/dL — ABNORMAL HIGH (ref 70–99)

## 2021-10-13 LAB — LIPASE, BLOOD: Lipase: 16 U/L (ref 11–51)

## 2021-10-13 MED ORDER — HYDROMORPHONE HCL 1 MG/ML IJ SOLN
1.0000 mg | Freq: Once | INTRAMUSCULAR | Status: AC
  Administered 2021-10-13: 1 mg via INTRAVENOUS
  Filled 2021-10-13: qty 1

## 2021-10-13 MED ORDER — IOHEXOL 300 MG/ML  SOLN
100.0000 mL | Freq: Once | INTRAMUSCULAR | Status: AC | PRN
  Administered 2021-10-13: 100 mL via INTRAVENOUS

## 2021-10-13 MED ORDER — LORAZEPAM 2 MG/ML IJ SOLN
1.0000 mg | Freq: Once | INTRAMUSCULAR | Status: AC | PRN
  Administered 2021-10-13: 1 mg via INTRAVENOUS
  Filled 2021-10-13: qty 1

## 2021-10-13 MED ORDER — PANTOPRAZOLE SODIUM 40 MG IV SOLR
40.0000 mg | Freq: Once | INTRAVENOUS | Status: AC
  Administered 2021-10-13: 40 mg via INTRAVENOUS
  Filled 2021-10-13: qty 10

## 2021-10-13 MED ORDER — GADOBUTROL 1 MMOL/ML IV SOLN
10.0000 mL | Freq: Once | INTRAVENOUS | Status: AC | PRN
  Administered 2021-10-13: 10 mL via INTRAVENOUS

## 2021-10-13 MED ORDER — ONDANSETRON HCL 4 MG/2ML IJ SOLN
4.0000 mg | Freq: Once | INTRAMUSCULAR | Status: AC
  Administered 2021-10-13: 4 mg via INTRAVENOUS
  Filled 2021-10-13: qty 2

## 2021-10-13 MED ORDER — PANTOPRAZOLE SODIUM 40 MG IV SOLR
40.0000 mg | INTRAVENOUS | Status: DC
  Administered 2021-10-14 – 2021-10-16 (×3): 40 mg via INTRAVENOUS
  Filled 2021-10-13 (×3): qty 10

## 2021-10-13 MED ORDER — ONDANSETRON HCL 4 MG/2ML IJ SOLN
4.0000 mg | Freq: Four times a day (QID) | INTRAMUSCULAR | Status: DC | PRN
  Administered 2021-10-13 – 2021-10-14 (×2): 4 mg via INTRAVENOUS
  Filled 2021-10-13 (×2): qty 2

## 2021-10-13 MED ORDER — METOPROLOL SUCCINATE ER 50 MG PO TB24
50.0000 mg | ORAL_TABLET | Freq: Two times a day (BID) | ORAL | Status: DC
  Administered 2021-10-13 – 2021-10-16 (×7): 50 mg via ORAL
  Filled 2021-10-13 (×7): qty 1

## 2021-10-13 MED ORDER — INSULIN ASPART 100 UNIT/ML IJ SOLN
0.0000 [IU] | Freq: Four times a day (QID) | INTRAMUSCULAR | Status: DC
  Administered 2021-10-13 (×2): 3 [IU] via SUBCUTANEOUS
  Administered 2021-10-14: 2 [IU] via SUBCUTANEOUS
  Administered 2021-10-14: 5 [IU] via SUBCUTANEOUS
  Administered 2021-10-14: 3 [IU] via SUBCUTANEOUS
  Administered 2021-10-14: 5 [IU] via SUBCUTANEOUS
  Administered 2021-10-15: 3 [IU] via SUBCUTANEOUS
  Administered 2021-10-15 (×3): 2 [IU] via SUBCUTANEOUS
  Administered 2021-10-16: 7 [IU] via SUBCUTANEOUS
  Administered 2021-10-16: 3 [IU] via SUBCUTANEOUS
  Administered 2021-10-16: 2 [IU] via SUBCUTANEOUS

## 2021-10-13 MED ORDER — ACETAMINOPHEN 650 MG RE SUPP: 650.0000 mg | Freq: Four times a day (QID) | RECTAL | Status: DC | PRN

## 2021-10-13 MED ORDER — FAMOTIDINE IN NACL 20-0.9 MG/50ML-% IV SOLN
20.0000 mg | Freq: Once | INTRAVENOUS | Status: AC
  Administered 2021-10-13: 20 mg via INTRAVENOUS
  Filled 2021-10-13: qty 50

## 2021-10-13 MED ORDER — SODIUM CHLORIDE 0.9% FLUSH
3.0000 mL | Freq: Once | INTRAVENOUS | Status: DC
  Filled 2021-10-13: qty 3

## 2021-10-13 MED ORDER — HYDROMORPHONE HCL 1 MG/ML IJ SOLN
1.0000 mg | INTRAMUSCULAR | Status: DC | PRN
  Administered 2021-10-13 – 2021-10-16 (×5): 1 mg via INTRAVENOUS
  Filled 2021-10-13 (×5): qty 1

## 2021-10-13 MED ORDER — ONDANSETRON HCL 4 MG PO TABS: 4.0000 mg | ORAL_TABLET | Freq: Four times a day (QID) | ORAL | Status: DC | PRN

## 2021-10-13 MED ORDER — PIPERACILLIN-TAZOBACTAM 3.375 G IVPB 30 MIN
3.3750 g | Freq: Three times a day (TID) | INTRAVENOUS | Status: DC
  Administered 2021-10-13 – 2021-10-16 (×9): 3.375 g via INTRAVENOUS
  Filled 2021-10-13 (×21): qty 50

## 2021-10-13 MED ORDER — ACETAMINOPHEN 325 MG PO TABS: 650.0000 mg | ORAL_TABLET | Freq: Four times a day (QID) | ORAL | Status: DC | PRN

## 2021-10-13 MED ORDER — SODIUM CHLORIDE 0.9 % IV BOLUS
250.0000 mL | Freq: Once | INTRAVENOUS | Status: AC
  Administered 2021-10-13: 250 mL via INTRAVENOUS

## 2021-10-13 MED ORDER — INSULIN ASPART 100 UNIT/ML IJ SOLN
10.0000 [IU] | Freq: Once | INTRAMUSCULAR | Status: AC
  Administered 2021-10-13: 10 [IU] via SUBCUTANEOUS

## 2021-10-13 MED ORDER — SPIRONOLACTONE 12.5 MG HALF TABLET
12.5000 mg | ORAL_TABLET | Freq: Every day | ORAL | Status: DC
  Administered 2021-10-13 – 2021-10-16 (×4): 12.5 mg via ORAL
  Filled 2021-10-13 (×4): qty 1

## 2021-10-13 MED ORDER — SACUBITRIL-VALSARTAN 97-103 MG PO TABS
1.0000 | ORAL_TABLET | Freq: Two times a day (BID) | ORAL | Status: DC
  Administered 2021-10-13 – 2021-10-16 (×7): 1 via ORAL
  Filled 2021-10-13 (×7): qty 1

## 2021-10-13 MED ORDER — SODIUM CHLORIDE 0.45 % IV SOLN: INTRAVENOUS | Status: DC

## 2021-10-13 NOTE — ED Notes (Signed)
Per MD pt does not need 2nd trop

## 2021-10-13 NOTE — H&P (Signed)
History and Physical    Patient: Richard Doyle DOB: 12/18/1975 DOA: 10/13/2021 DOS: the patient was seen and examined on 10/13/2021 PCP: Curlene Labrum, MD  Patient coming from: Home  Chief Complaint:  Chief Complaint  Patient presents with   Abdominal Pain    HPI: Richard Doyle is a 46 y.o. male with medical history significant of osteoarthritis, asthma, chronic combined systolic and diastolic heart failure, type II DM, hypertension, mixed hyperlipidemia, class III obesity who is coming from [facility] ED where he presented with history of sudden abdominal pain that started yesterday evening after dinner associated with nausea, multiple episodes of emesis, multiple episodes of diarrhea and lightheadedness.  Deny constipation, melena or hematochezia.  No flank pain, dysuria, frequency or hematuria.  He has not had these pain before.  He denies fever, but complains of chills and night sweats.  No rhinorrhea, sore throat, dyspnea, wheezing or hemoptysis.  No chest pain, palpitations, diaphoresis, recent pitting edema of the lower extremities, PND orthopnea.  No polyuria, polydipsia, polyphagia or blurry vision.  ED course: Initial vital signs were temperature 98.3 F, pulse 94, respirations 20, BP 173/100 mmHg and O2 sat 100% on room air.  The patient received a 250 mL NS bolus, pantoprazole 40 mg IVP, ondansetron 4 mg IVP and hydromorphone 1 mg IVP.  Lab work: Urinalysis showed increase of specific gravity and glucosuria more than 1000 mg/dL.  CBC is her white count 11.0 with 70% neutrophils, hemoglobin 16.2 g/dL platelets 319.  Repeat CBC was normal.  CMP showed transaminitis with total bilirubin of 1.3 mg/dL which worsened when it was repeated this morning with higher level of transaminases and a total bilirubin of 2.3 mg/dL now.  Imaging: CT abdomen/pelvis with contrast showed gallbladder wall thickening suspicious for cholecystitis.  RUQ US showed thickened and edematous gallbladder  wall without cholelithiasis.  There was hepatic asteatosis.  There was mild dilatation of the CBD without any definite intrahepatic biliary ductal dilatation.  MRI abdomen/MRCP to be considered.  Please see images and full radiology report for further details.  Review of Systems: As mentioned in the history of present illness. All other systems reviewed and are negative. Past Medical History:  Diagnosis Date   Arthritis    Asthma    Congestive heart failure (CHF) ([place])    Diabetes mellitus without complication ([place])    Hypertension    Mixed hyperlipidemia    Past Surgical History:  Procedure Laterality Date   KNEE SURGERY     Social History:  reports that he quit smoking about 2 months ago. His smoking use included cigarettes. He uses smokeless tobacco. He reports that he does not currently use alcohol. He reports that he does not use drugs.  No Known Allergies  No family history on file.  Prior to Admission medications   Medication Sig Start Date End Date Taking? Authorizing Provider  Aspirin-Acetaminophen-Caffeine (GOODY HEADACHE PO) Take 1 Package by mouth daily as needed (pain, HA).   Yes [provider]  fluticasone (FLONASE) 50 MCG/ACT nasal spray Place 1 spray into both nostrils daily. 09/30/21  Yes [provider]  furosemide (LASIX) 20 MG tablet Take 40 mg by mouth daily as needed for fluid. 09/28/21  Yes [provider]  insulin glargine (LANTUS SOLOSTAR) 100 UNIT/ML Solostar Pen Inject 25 Units into the skin in the morning. 12/18/19  Yes [provider]  insulin lispro (HUMALOG KWIKPEN) 200 UNIT/ML KwikPen Inject 15-20 Units into the skin 3 (three) times daily after meals.  07/21/20  Yes [provider]  JARDIANCE 25 MG TABS tablet Take 25 mg by mouth daily. 05/08/21  Yes [provider]  meclizine (ANTIVERT) 25 MG tablet Take 25-50 mg by mouth 3 (three) times daily as needed for dizziness. 09/13/21  Yes [provider]   metFORMIN (GLUCOPHAGE-XR) 500 MG 24 hr tablet Take 1,000 mg by mouth daily with breakfast. 07/22/20  Yes [provider]  metoprolol succinate (TOPROL-XL) 50 MG 24 hr tablet Take 50 mg by mouth 2 (two) times daily. 09/28/21  Yes [provider]  neomycin-polymyxin-hydrocortisone (CORTISPORIN) 3.5-10000-1 OTIC suspension Place 3 drops into the right ear 2 (two) times daily. 09/30/21  Yes [provider]  ofloxacin (FLOXIN) 0.3 % OTIC solution Place 5 drops into the right ear 2 (two) times daily. 10/12/21  Yes [provider]  sacubitril-valsartan (ENTRESTO) 97-103 MG Take 1 tablet by mouth 2 (two) times daily. 07/04/19  Yes [provider]  Semaglutide, 1 MG/DOSE, (OZEMPIC, 1 MG/DOSE,) 4 MG/3ML SOPN Inject 1 mg into the skin once a week. Sunday   Yes [provider]  spironolactone (ALDACTONE) 25 MG tablet Take 12.5 mg by mouth daily. 09/28/21  Yes [provider]    Physical Exam: Vitals:   10/13/21 0745 10/13/21 0800 10/13/21 0850 10/13/21 1201  BP: 126/77 135/82 (!) 162/98 (!) 156/81  Pulse: 84 85 85 85  Resp: 18  18 18   Temp:   98.1 F (36.7 C) 98.8 F (37.1 C)  TempSrc:   Oral Oral  SpO2: 99% 99% 95% 97%  Weight:      Height:       Physical Exam Vitals and nursing note reviewed.  HENT:     Head: Normocephalic and atraumatic.  Eyes:     General: No scleral icterus.    Pupils: Pupils are equal, round, and reactive to light.  Cardiovascular:     Rate and Rhythm: Normal rate and regular rhythm.  Pulmonary:     Effort: Pulmonary effort is normal.     Breath sounds: Normal breath sounds.  Abdominal:     General: Abdomen is protuberant. Bowel sounds are normal. There is no distension.     Palpations: Abdomen is soft.     Tenderness: There is abdominal tenderness in the right upper quadrant and epigastric area. There is no guarding or rebound.  Genitourinary:    Testes:        Right: Swelling not present.        Left:  Swelling not present.  Skin:    General: Skin is warm and dry.  Neurological:     General: No focal deficit present.     Mental Status: He is alert and oriented to person, place, and time.  Psychiatric:        Mood and Affect: Mood normal.        Behavior: Behavior normal.     Data Reviewed:  There are no new results to review at this time.  Assessment and Plan: Principal Problem:   Transaminitis Questionable cholecystitis. Observation/MedSurg. Keep NPO. Analgesics as needed. Antiemetics as needed. Begin Zosyn 3.375 g every 8 hours. Continue pantoprazole 40 mg IVP daily. Check MRI abdomen/MRCP. Discussed with GI, they will see after MRCP.  Active Problems:   Type 2 diabetes mellitus (HCC) Currently NPO. Hold long-acting insulin. CBG monitoring every 6 hours with low-dose RI SS.    Chronic combined systolic and diastolic congestive heart failure (HCC) Continue Entresto 1 tablet p.o. twice daily. Continue metoprolol  50 mg p.o. twice daily. Hold furosemide and the spironolactone.    Hypocalcemia Recheck calcium level in AM.    Class 3 obesity (HCC) Lifestyle modifications. Follow-up with PCP and/or endocrinology.    Advance Care Planning:   Code Status: Full Code   Consults: [facility] will see after MRCP.  Family Communication:   Severity of Illness: The appropriate patient status for this patient is OBSERVATION. Observation status is judged to be reasonable and necessary in order to provide the required intensity of service to ensure the patient's safety. The patient's presenting symptoms, physical exam findings, and initial radiographic and laboratory data in the context of their medical condition is felt to place them at decreased risk for further clinical deterioration. Furthermore, it is anticipated that the patient will be medically stable for discharge from the hospital within 2 midnights of admission.   Author: Reubin Milan, MD 10/13/2021 5:12  PM  For on call review [email or web address].   This document was prepared using Dragon voice recognition software and may contain some unintended transcription errors.

## 2021-10-13 NOTE — Plan of Care (Signed)
  Problem: Pain Managment: Goal: General experience of comfort will improve Outcome: Progressing   Problem: Safety: Goal: Ability to remain free from injury will improve Outcome: Progressing   

## 2021-10-13 NOTE — ED Notes (Signed)
Following pain med administration, patient's SpO2 maintaining at 88%. Patient placed on [facility] at this time. SpO2 95%.

## 2021-10-13 NOTE — Progress Notes (Signed)
Transition of Care [facility]) Screening Note  Patient Details  Name: Richard Doyle Date of Birth: 11-28-1975  Transition of Care [facility]) CM/SW Contact:    Ewing Schlein, LCSW Phone Number: 10/13/2021, 11:22 AM  Transition of Care Department [facility]) has reviewed patient and no TOC needs have been identified at this time. We will continue to monitor patient advancement through interdisciplinary progression rounds. If new patient transition needs arise, please place a TOC consult.

## 2021-10-13 NOTE — ED Notes (Signed)
Pt resting normal breathing pattern, family w/ pt

## 2021-10-13 NOTE — ED Notes (Signed)
Md notified of CBG

## 2021-10-13 NOTE — ED Notes (Signed)
Patient transported to US 

## 2021-10-13 NOTE — ED Provider Notes (Signed)
[facility] Provider Note: Lowella DellJ. Lane Audra Bellard, MD, FACEP  CSN: [id number] MRN: [id number] ARRIVAL: 10/12/21 at 2350 ROOM: DB009/DB009   CHIEF COMPLAINT  Abdominal Pain   HISTORY OF PRESENT ILLNESS  10/13/21 12:21 AM Richard FudgeWalter Doyle is a 46 y.o. male developed abdominal pain about 2 hours prior to arrival.  The abdominal pain is located in his epigastrium and left upper quadrant radiating into his chest.  He rates the pain is 9 out of 10, worse with movement or palpation.  He subsequently developed nausea, vomiting and lightheadedness.  He has also had some mild diarrhea.   Past Medical History:  Diagnosis Date   Arthritis    Asthma    Congestive heart failure (CHF) (HCC)    Diabetes mellitus without complication (HCC)    Hypertension    Mixed hyperlipidemia     Past Surgical History:  Procedure Laterality Date   KNEE SURGERY      No family history on file.  Social History   Tobacco Use   Smoking status: Former    Types: Cigarettes    Quit date: 07/22/2021    Years since quitting: 0.2   Smokeless tobacco: Current  Substance Use Topics   Alcohol use: Not Currently   Drug use: Never    Prior to Admission medications   Not on File    Allergies Patient has no known allergies.   REVIEW OF SYSTEMS  Negative except as noted here or in the History of Present Illness.   PHYSICAL EXAMINATION  Initial Vital Signs Blood pressure (!) 173/100, pulse 94, temperature 98.3 F (36.8 C), temperature source Oral, resp. rate 20, height 6\' 2"  (1.88 m), weight (!) 141.5 kg, SpO2 100 %.  Examination General: Well-developed, high BMI male in no acute distress; appearance consistent with age of record HENT: normocephalic; atraumatic Eyes: Normal appearance Neck: supple Heart: regular rate and rhythm Lungs: clear to auscultation bilaterally Abdomen: soft; nondistended; epigastric, left upper quadrant and left lateral tenderness; no pulsatile mass palpated; bowel sounds  present Extremities: No deformity; full range of motion; pulses normal Neurologic: Awake, alert and oriented; motor function intact in all extremities and symmetric; no facial droop Skin: Warm and dry Psychiatric: Grimacing   RESULTS  Summary of this visit's results, reviewed and interpreted by myself:   EKG Interpretation  Date/Time:  Wednesday October 13 2021 00:05:15 EST Ventricular Rate:  91 PR Interval:  204 QRS Duration: 126 QT Interval:  414 QTC Calculation: 509 R Axis:   -58 Text Interpretation: Normal sinus rhythm Possible Left atrial enlargement Left axis deviation Left bundle branch block Confirmed by Tron Flythe ([id number]) on 10/13/2021 12:12:58 AM       Laboratory Studies: Results for orders placed or performed during the hospital encounter of 10/13/21 (from the past 24 hour(s))  Lipase, blood     Status: None   Collection Time: 10/13/21 12:10 AM  Result Value Ref Range   Lipase 16 11 - 51 U/L  Comprehensive metabolic panel     Status: Abnormal   Collection Time: 10/13/21 12:10 AM  Result Value Ref Range   Sodium 138 135 - 145 mmol/L   Potassium 3.7 3.5 - 5.1 mmol/L   Chloride 100 98 - 111 mmol/L   CO2 26 22 - 32 mmol/L   Glucose, Bld 249 (H) 70 - 99 mg/dL   BUN 16 6 - 20 mg/dL   Creatinine, Ser 0.860.78 0.61 - 1.24 mg/dL   Calcium 9.3 8.9 - 57.810.3 mg/dL   Total Protein  6.2 (L) 6.5 - 8.1 g/dL   Albumin 4.0 3.5 - 5.0 g/dL   AST 300 (H) 15 - 41 U/L   ALT 98 (H) 0 - 44 U/L   Alkaline Phosphatase 144 (H) 38 - 126 U/L   Total Bilirubin 1.3 (H) 0.3 - 1.2 mg/dL   GFR, Estimated >92 >33 mL/min   Anion gap 12 5 - 15  Troponin I (High Sensitivity)     Status: None   Collection Time: 10/13/21 12:10 AM  Result Value Ref Range   Troponin I (High Sensitivity) 16 <18 ng/L  CBC with Differential/Platelet     Status: Abnormal   Collection Time: 10/13/21 12:10 AM  Result Value Ref Range   WBC 11.0 (H) 4.0 - 10.5 K/uL   RBC 5.54 4.22 - 5.81 MIL/uL   Hemoglobin 16.2 13.0 -  17.0 g/dL   HCT 00.7 62.2 - 63.3 %   MCV 85.6 80.0 - 100.0 fL   MCH 29.2 26.0 - 34.0 pg   MCHC 34.2 30.0 - 36.0 g/dL   RDW 35.4 56.2 - 56.3 %   Platelets 319 150 - 400 K/uL   nRBC 0.0 0.0 - 0.2 %   Neutrophils Relative % 70 %   Neutro Abs 7.7 1.7 - 7.7 K/uL   Lymphocytes Relative 19 %   Lymphs Abs 2.1 0.7 - 4.0 K/uL   Monocytes Relative 8 %   Monocytes Absolute 0.9 0.1 - 1.0 K/uL   Eosinophils Relative 1 %   Eosinophils Absolute 0.2 0.0 - 0.5 K/uL   Basophils Relative 1 %   Basophils Absolute 0.1 0.0 - 0.1 K/uL   Immature Granulocytes 1 %   Abs Immature Granulocytes 0.07 0.00 - 0.07 K/uL  Urinalysis, Routine w reflex microscopic Urine, Clean Catch     Status: Abnormal   Collection Time: 10/13/21  2:09 AM  Result Value Ref Range   Color, Urine YELLOW YELLOW   APPearance CLEAR CLEAR   Specific Gravity, Urine 1.046 (H) 1.005 - 1.030   pH 7.0 5.0 - 8.0   Glucose, UA >1,000 (A) NEGATIVE mg/dL   Hgb urine dipstick NEGATIVE NEGATIVE   Bilirubin Urine NEGATIVE NEGATIVE   Ketones, ur NEGATIVE NEGATIVE mg/dL   Protein, ur TRACE (A) NEGATIVE mg/dL   Nitrite NEGATIVE NEGATIVE   Leukocytes,Ua NEGATIVE NEGATIVE   RBC / HPF 0-5 0 - 5 RBC/hpf   WBC, UA 0-5 0 - 5 WBC/hpf  CBG monitoring, ED     Status: Abnormal   Collection Time: 10/13/21  4:53 AM  Result Value Ref Range   Glucose-Capillary 271 (H) 70 - 99 mg/dL   Imaging Studies: CT ABDOMEN PELVIS W CONTRAST  Result Date: 10/13/2021 CLINICAL DATA:  Acute abdominal pain EXAM: CT ABDOMEN AND PELVIS WITH CONTRAST TECHNIQUE: Multidetector CT imaging of the abdomen and pelvis was performed using the standard protocol following bolus administration of intravenous contrast. RADIATION DOSE REDUCTION: This exam was performed according to the departmental dose-optimization program which includes automated exposure control, adjustment of the mA and/or kV according to patient size and/or use of iterative reconstruction technique. CONTRAST:   OMNIPAQUE IOHEXOL 300 MG/ML  SOLN COMPARISON:  None. FINDINGS: Lower chest: No acute abnormality. Hepatobiliary: Fatty infiltration of the liver is noted. Gallbladder is well distended. Gallbladder wall thickening is noted suspicious for cholecystitis. Pancreas: Unremarkable. No pancreatic ductal dilatation or surrounding inflammatory changes. Spleen: Normal in size without focal abnormality. Adrenals/Urinary Tract: Adrenal glands are within normal limits. Kidneys demonstrate a normal enhancement pattern bilaterally.  Small renal cysts are noted bilaterally. Nonobstructing stones are noted in the right kidney the largest of which measures 3 mm in the lower pole. Tiny nonobstructing left renal stone is noted as well. The ureters are within normal limits. Bladder is well distended. Stomach/Bowel: Appendix is well visualized and within normal limits. No obstructive or inflammatory changes of the colon are noted. Stomach is within normal limits. Small bowel is unremarkable. Vascular/Lymphatic: Aortic atherosclerosis. No enlarged abdominal or pelvic lymph nodes. Reproductive: Prostate is unremarkable. Other: No abdominal wall hernia or abnormality. No abdominopelvic ascites. Musculoskeletal: Degenerative changes of the lumbar spine are noted. IMPRESSION: Gallbladder wall thickening suspicious for cholecystitis. Ultrasound is recommended for further evaluation. Nonobstructing renal calculi bilaterally. Appendix is well visualized and within normal limits. Electronically Signed   By: Alcide Clever M.D.   On: 10/13/2021 01:37   US Abdomen Limited RUQ (LIVER/GB)  Result Date: 10/13/2021 CLINICAL DATA:  46 year old male presenting with abnormal appearance of the gallbladder on recent CT examination. History of acute abdominal pain. EXAM: ULTRASOUND ABDOMEN LIMITED RIGHT UPPER QUADRANT COMPARISON:  No prior abdominal ultrasound. CT the abdomen and pelvis 10/13/2021. FINDINGS: Gallbladder: No definite calculi are identified  within the gallbladder. Gallbladder is only mildly distended. Gallbladder wall appears heterogeneously thickened and edematous, most evident adjacent to the liver where the edematous wall measures up to 14 mm in diameter. The fundal portion of the gallbladder wall is normal in thickness. No significant volume of pericholecystic fluid. Per report from the sonographer, the patient was administered pain medication, and accurate assessment for sonographic Murphy's sign was not possible on today's examination. Common bile duct: Diameter: 9.1 mm Liver: No focal lesion identified. Heterogeneously increased hepatic echogenicity, indicative of a background of heterogeneous hepatic steatosis. Portal vein is patent on color Doppler imaging with normal direction of blood flow towards the liver. Other: None. IMPRESSION: 1. Thickened and edematous gallbladder wall. Given the absence of cholelithiasis, this finding is presumably reflective of intrinsic liver disease, particularly in the setting of hepatic steatosis. No definitive imaging findings to clearly indicate acute cholecystitis are otherwise noted at this time. 2. Hepatic steatosis. 3. Mild dilatation of the common bile duct. No definite intrahepatic biliary ductal dilatation. If there is clinical concern for choledocholithiasis or other cause of biliary tract obstruction, further evaluation with abdominal MRI with and without IV gadolinium with MRCP could be considered. Electronically Signed   By: Trudie Reed M.D.   On: 10/13/2021 06:08    ED COURSE and MDM  Nursing notes, initial and subsequent vitals signs, including pulse oximetry, reviewed and interpreted by myself.  Vitals:   10/13/21 0245 10/13/21 0315 10/13/21 0345 10/13/21 0500  BP: 139/88 (!) 158/81 (!) 158/88 (!) 155/87  Pulse: 92 89 86 88  Resp: (!) 21 (!) 22 (!) 21 20  Temp:      TempSrc:      SpO2: 98% 99% 99% 96%  Weight:      Height:       Medications  sodium chloride flush (NS) 0.9 %  injection 3 mL (has no administration in time range)  ondansetron (ZOFRAN) injection 4 mg (4 mg Intravenous Given 10/13/21 0034)  HYDROmorphone (DILAUDID) injection 1 mg (1 mg Intravenous Given 10/13/21 0034)  sodium chloride 0.9 % bolus 250 mL (0 mLs Intravenous Stopped 10/13/21 0212)  iohexol (OMNIPAQUE) 300 MG/ML solution 100 mL (100 mLs Intravenous Contrast Given 10/13/21 0116)  pantoprazole (PROTONIX) injection 40 mg (40 mg Intravenous Given 10/13/21 0318)  insulin aspart (novoLOG) injection  10 Units (10 Units Subcutaneous Given 10/13/21 0456)  HYDROmorphone (DILAUDID) injection 1 mg (1 mg Intravenous Given 10/13/21 0502)   3:03 AM CT findings are suggestive of acute cholecystitis and the patient does have elevated transaminases.  Even though his pain is epigastric and left upper quadrant we will obtain an ultrasound of the right upper quadrant to evaluate his gallbladder and biliary tract.  Abdominal pain may manifest itself in a different location than the actual pathology.  Pain adequately controlled at the present time.   6:22 AM Patient still having abdominal pain, now including the right upper quadrant.  Ultrasound shows no gallstones in the gallbladder and no definitive signs of cholecystitis.  Nevertheless there is dilatation of the common bile duct with elevated LFTs and an MRCP is recommended as biliary obstruction is a possibility especially in the setting of acute onset of pain.  We will have him admitted to the hospitalist service for further evaluation and possible gastroenterology consult.  Acute hepatitis panel ordered as he could have a previously undiagnosed hepatitis (most commonly C) causing his hepatic steatosis and associated changes.  6:41 AM Dr. Julian Reil accepts for admission to the hospitalist service.  PROCEDURES  Procedures   ED DIAGNOSES     ICD-10-CM   1. Epigastric abdominal pain  R10.13 US Abdomen Limited RUQ (LIVER/GB)    US Abdomen Limited RUQ (LIVER/GB)     2. Elevated LFTs  R79.89     3. Common bile duct dilatation  K83.8     4. Hepatic steatosis  K76.0          Issis Lindseth, Jonny Ruiz, MD 10/13/21 [phone]

## 2021-10-14 ENCOUNTER — Observation Stay (HOSPITAL_COMMUNITY): Payer: BC Managed Care – PPO

## 2021-10-14 ENCOUNTER — Encounter (HOSPITAL_COMMUNITY): Payer: Self-pay | Admitting: General Surgery

## 2021-10-14 DIAGNOSIS — R7401 Elevation of levels of liver transaminase levels: Secondary | ICD-10-CM | POA: Diagnosis not present

## 2021-10-14 DIAGNOSIS — R1011 Right upper quadrant pain: Secondary | ICD-10-CM

## 2021-10-14 DIAGNOSIS — I10 Essential (primary) hypertension: Secondary | ICD-10-CM

## 2021-10-14 DIAGNOSIS — E876 Hypokalemia: Secondary | ICD-10-CM

## 2021-10-14 DIAGNOSIS — K7689 Other specified diseases of liver: Secondary | ICD-10-CM | POA: Diagnosis not present

## 2021-10-14 LAB — COMPREHENSIVE METABOLIC PANEL
ALT: 372 U/L — ABNORMAL HIGH (ref 0–44)
AST: 172 U/L — ABNORMAL HIGH (ref 15–41)
Albumin: 3.7 g/dL (ref 3.5–5.0)
Alkaline Phosphatase: 266 U/L — ABNORMAL HIGH (ref 38–126)
Anion gap: 8 (ref 5–15)
BUN: 11 mg/dL (ref 6–20)
CO2: 23 mmol/L (ref 22–32)
Calcium: 8 mg/dL — ABNORMAL LOW (ref 8.9–10.3)
Chloride: 100 mmol/L (ref 98–111)
Creatinine, Ser: 0.72 mg/dL (ref 0.61–1.24)
GFR, Estimated: >60 mL/min (ref >60)
Glucose, Bld: 237 mg/dL — ABNORMAL HIGH (ref 70–99)
Potassium: 3.1 mmol/L — ABNORMAL LOW (ref 3.5–5.1)
Sodium: 131 mmol/L — ABNORMAL LOW (ref 135–145)
Total Bilirubin: 2.5 mg/dL — ABNORMAL HIGH (ref 0.3–1.2)
Total Protein: 6.5 g/dL (ref 6.5–8.1)

## 2021-10-14 LAB — CBC
HCT: 48.2 % (ref 39.0–52.0)
Hemoglobin: 16.4 g/dL (ref 13.0–17.0)
MCH: 29.5 pg (ref 26.0–34.0)
MCHC: 34 g/dL (ref 30.0–36.0)
MCV: 86.8 fL (ref 80.0–100.0)
Platelets: 250 10*3/uL (ref 150–400)
RBC: 5.55 MIL/uL (ref 4.22–5.81)
RDW: 13.9 % (ref 11.5–15.5)
WBC: 7 10*3/uL (ref 4.0–10.5)
nRBC: 0 % (ref 0.0–0.2)

## 2021-10-14 LAB — GLUCOSE, CAPILLARY
Glucose-Capillary: 178 mg/dL — ABNORMAL HIGH (ref 70–99)
Glucose-Capillary: 194 mg/dL — ABNORMAL HIGH (ref 70–99)
Glucose-Capillary: 247 mg/dL — ABNORMAL HIGH (ref 70–99)
Glucose-Capillary: 261 mg/dL — ABNORMAL HIGH (ref 70–99)

## 2021-10-14 LAB — HIV ANTIBODY (ROUTINE TESTING W REFLEX): HIV Screen 4th Generation wRfx: NONREACTIVE

## 2021-10-14 LAB — HEMOGLOBIN A1C
Hgb A1c MFr Bld: 11.3 % — ABNORMAL HIGH (ref 4.8–5.6)
Mean Plasma Glucose: 278 mg/dL

## 2021-10-14 MED ORDER — POTASSIUM CHLORIDE 10 MEQ/100ML IV SOLN
10.0000 meq | INTRAVENOUS | Status: AC
  Administered 2021-10-14 – 2021-10-15 (×4): 10 meq via INTRAVENOUS
  Filled 2021-10-14 (×4): qty 100

## 2021-10-14 MED ORDER — ENOXAPARIN SODIUM 40 MG/0.4ML IJ SOSY: 40.0000 mg | PREFILLED_SYRINGE | INTRAMUSCULAR | Status: DC

## 2021-10-14 MED ORDER — ENOXAPARIN SODIUM 80 MG/0.8ML IJ SOSY
70.0000 mg | PREFILLED_SYRINGE | INTRAMUSCULAR | Status: DC
  Administered 2021-10-14 – 2021-10-15 (×2): 70 mg via SUBCUTANEOUS
  Filled 2021-10-14 (×2): qty 0.8

## 2021-10-14 MED ORDER — TECHNETIUM TC 99M MEBROFENIN IV KIT
7.5000 | PACK | Freq: Once | INTRAVENOUS | Status: AC
  Administered 2021-10-14: 7.5 via INTRAVENOUS

## 2021-10-14 MED ORDER — INSULIN GLARGINE-YFGN 100 UNIT/ML ~~LOC~~ SOLN
12.0000 [IU] | Freq: Every day | SUBCUTANEOUS | Status: DC
Start: 2021-10-14 — End: 2021-10-16
  Administered 2021-10-14 – 2021-10-15 (×2): 12 [IU] via SUBCUTANEOUS
  Filled 2021-10-14 (×3): qty 0.12

## 2021-10-14 NOTE — Assessment & Plan Note (Addendum)
Resume home regimen. 

## 2021-10-14 NOTE — Progress Notes (Signed)
PROGRESS NOTE    Richard Doyle  DYJ:[id number] DOB: 08-28-1975 DOA: 10/13/2021 PCP: Curlene Labrum, MD  Chief Complaint  Patient presents with   Abdominal Pain    Brief Narrative:  46 yo with hx heart failure, OA, T2DM, hypertension, dyslipidemia, obesity presenting with RUQ abdominal discomfort and elevated LFT's.  See below for additional details    Assessment & Plan:   Principal Problem:   Transaminitis Active Problems:   Right upper quadrant abdominal pain   Type 2 diabetes mellitus (HCC)   Chronic combined systolic and diastolic congestive heart failure (HCC)   Hypokalemia   Essential hypertension   Hypocalcemia   Class 3 obesity (HCC)   Assessment and Plan: * Transaminitis- (present on admission) Negative acute hepatitis panel MRCP with normal caliber and course of CBD, does have fatty infiltration of liver LFT's fluctuating today, bili higher, alk phos higher, but AST and ALT slightly improved Will follow Consider discussion with GI again 2/24 AM   Right upper quadrant abdominal pain CT with findings concerning for cholecystitis RUQ [place] without findings to suggest cholecystitis MRCP shows possible acalculous cholecystitis HIDA with evidence of hepatocellular dysfunction, patent cystic and common hepatic ducts Will continue supportive care Appreciate surgery assistance  Type 2 diabetes mellitus ([place]) SSI Will add 12 units basal (he's on lantus 25 units daily at home and 15-20 humalog TID as well as jardiance, metformin, ozempic)  Chronic combined systolic and diastolic congestive heart failure ([place])- (present on admission) Echo 11/2019 with EF >55% Continue metop, spironolactone, and entresto Lasix currently on hold  Essential hypertension Entresto, metop, spiro Lasix on hold  Hypokalemia Replace and follow   DVT prophylaxis: lovenox Code Status: full Family Communication: wife Disposition:   Status is: Observation The patient will require  care spanning > 2 midnights and should be moved to inpatient because: need for IV abx, continued GI and surgery eval   Consultants:  Surgery GI  Procedures:  none  Antimicrobials:  Anti-infectives (From admission, onward)    Start     Dose/Rate Route Frequency Ordered Stop   10/13/21 1200  piperacillin-tazobactam (ZOSYN) IVPB 3.375 g        3.375 g 12.5 mL/hr over 240 Minutes Intravenous Every 8 hours 10/13/21 1129         Subjective: No new complaints  Objective: Vitals:   10/14/21 0551 10/14/21 1050 10/14/21 1357 10/14/21 1849  BP: (!) 143/78 129/84 (!) 131/93 (!) 148/84  Pulse: 89 81 75 78  Resp: _0 Temp: 98.5 F (36.9 C) 98.2 F (36.8 C) 98.2 F (36.8 C) 98.4 F (36.9 C)  TempSrc: Oral Oral Oral   SpO2: 100% 95% 99% 97%  Weight:      Height:        Intake/Output Summary (Last 24 hours) at 10/14/2021 1942 Last data filed at 10/14/2021 1600 Gross per 24 hour  Intake 809.96 ml  Output --  Net 809.96 ml   Filed Weights   10/13/21 0001  Weight: (!) 141.5 kg    Examination:  General exam: Appears calm and comfortable  Respiratory system: unlabored Cardiovascular system: RRR. Gastrointestinal system: RUQ abdominal discomfort, postive murphy's Central nervous system: Alert and oriented. No focal neurological deficits. Extremities: no LEE    Data Reviewed: I have personally reviewed following labs and imaging studies  CBC: Recent Labs  Lab 10/13/21 0010 10/13/21 0856 10/14/21 0428  WBC 11.0* 6.7 7.0  NEUTROABS 7.7  --   --   HGB 16.2 15.4  16.4  HCT 47.4 45.7 48.2  MCV 85.6 86.9 86.8  PLT 319 289 973    Basic Metabolic Panel: Recent Labs  Lab 10/13/21 0010 10/13/21 0856 10/14/21 0428  NA 138 134* 131*  K 3.7 3.8 3.1*  CL 100 100 100  CO2 _0 GLUCOSE 249* 265* 237*  BUN _1 CREATININE 0.78 0.64 0.72  CALCIUM 9.3 8.3* 8.0*  MG  --  1.8  --     GFR: Estimated Creatinine Clearance: 171 mL/min (by C-G formula  based on SCr of 0.72 mg/dL).  Liver Function Tests: Recent Labs  Lab 10/13/21 0010 10/13/21 0856 10/14/21 0428  AST 137* 491* 172*  ALT 98* 497* 372*  ALKPHOS 144* 219* 266*  BILITOT 1.3* 2.3* 2.5*  PROT 6.2* 7.1 6.5  ALBUMIN 4.0 3.8 3.7    CBG: Recent Labs  Lab 10/13/21 1745 10/13/21 2351 10/14/21 0544 10/14/21 1228 10/14/21 1813  GLUCAP 214* 258* 261* 194* 247*     Recent Results (from the past 240 hour(s))  Resp Panel by RT-PCR (Flu Tamora Huneke&B, Covid) Nasopharyngeal Swab     Status: None   Collection Time: 10/13/21  6:23 AM   Specimen: Nasopharyngeal Swab; Nasopharyngeal(NP) swabs in vial transport medium  Result Value Ref Range Status   SARS Coronavirus 2 by RT PCR NEGATIVE NEGATIVE Final    Comment: (NOTE) SARS-CoV-2 target nucleic acids are NOT DETECTED.  The SARS-CoV-2 RNA is generally detectable in upper respiratory specimens during the acute phase of infection. The lowest concentration of SARS-CoV-2 viral copies this assay can detect is 138 copies/mL. Mayreli Alden negative result does not preclude SARS-Cov-2 infection and should not be used as the sole basis for treatment or other patient management decisions. Jacklyn Branan negative result may occur with  improper specimen collection/handling, submission of specimen other than nasopharyngeal swab, presence of viral mutation(s) within the areas targeted by this assay, and inadequate number of viral copies(<138 copies/mL). Marcelina Mclaurin negative result must be combined with clinical observations, patient history, and epidemiological information. The expected result is Negative.  Fact Sheet for Patients:  EntrepreneurPulse.com.au  Fact Sheet for Healthcare Providers:  IncredibleEmployment.be  This test is no t yet approved or cleared by the [place] FDA and  has been authorized for detection and/or diagnosis of SARS-CoV-2 by FDA under an Emergency Use Authorization (EUA). This EUA will remain  in effect  (meaning this test can be used) for the duration of the COVID-19 declaration under Section 564(b)(1) of the Act, 21 U.S.C.section 360bbb-3(b)(1), unless the authorization is terminated  or revoked sooner.       Influenza Oland Arquette by PCR NEGATIVE NEGATIVE Final   Influenza B by PCR NEGATIVE NEGATIVE Final    Comment: (NOTE) The Xpert Xpress SARS-CoV-2/FLU/RSV plus assay is intended as an aid in the diagnosis of influenza from Nasopharyngeal swab specimens and should not be used as Aneliese Beaudry sole basis for treatment. Nasal washings and aspirates are unacceptable for Xpert Xpress SARS-CoV-2/FLU/RSV testing.  Fact Sheet for Patients: EntrepreneurPulse.com.au  Fact Sheet for Healthcare Providers: IncredibleEmployment.be  This test is not yet approved or cleared by the [place] FDA and has been authorized for detection and/or diagnosis of SARS-CoV-2 by FDA under an Emergency Use Authorization (EUA). This EUA will remain in effect (meaning this test can be used) for the duration of the COVID-19 declaration under Section 564(b)(1) of the Act, 21 U.S.C. section 360bbb-3(b)(1), unless the authorization is terminated or revoked.  Performed at [facility], [address], [place] [address]          Radiology Studies: CT ABDOMEN PELVIS W CONTRAST  Result Date: 10/13/2021 CLINICAL DATA:  Acute abdominal pain EXAM: CT ABDOMEN AND PELVIS WITH CONTRAST TECHNIQUE: Multidetector CT imaging of the abdomen and pelvis was performed using the standard protocol following bolus administration of intravenous contrast. RADIATION DOSE REDUCTION: This exam was performed according to the departmental dose-optimization program which includes automated exposure control, adjustment of the mA and/or kV according to patient size and/or use of iterative reconstruction technique. CONTRAST:  132m OMNIPAQUE IOHEXOL 300 MG/ML  SOLN COMPARISON:  None.  FINDINGS: Lower chest: No acute abnormality. Hepatobiliary: Fatty infiltration of the liver is noted. Gallbladder is well distended. Gallbladder wall thickening is noted suspicious for cholecystitis. Pancreas: Unremarkable. No pancreatic ductal dilatation or surrounding inflammatory changes. Spleen: Normal in size without focal abnormality. Adrenals/Urinary Tract: Adrenal glands are within normal limits. Kidneys demonstrate Lenell Lama normal enhancement pattern bilaterally. Small renal cysts are noted bilaterally. Nonobstructing stones are noted in the right kidney the largest of which measures 3 mm in the lower pole. Tiny nonobstructing left renal stone is noted as well. The ureters are within normal limits. Bladder is well distended. Stomach/Bowel: Appendix is well visualized and within normal limits. No obstructive or inflammatory changes of the colon are noted. Stomach is within normal limits. Small bowel is unremarkable. Vascular/Lymphatic: Aortic atherosclerosis. No enlarged abdominal or pelvic lymph nodes. Reproductive: Prostate is unremarkable. Other: No abdominal wall hernia or abnormality. No abdominopelvic ascites. Musculoskeletal: Degenerative changes of the lumbar spine are noted. IMPRESSION: Gallbladder wall thickening suspicious for cholecystitis. Ultrasound is recommended for further evaluation. Nonobstructing renal calculi bilaterally. Appendix is well visualized and within normal limits. Electronically Signed   By: MInez CatalinaM.D.   On: 10/13/2021 01:37   MR 3D Recon At Scanner  Result Date: 10/13/2021 CLINICAL DATA:  Jaundice. EXAM: MRI ABDOMEN WITHOUT AND WITH CONTRAST (INCLUDING MRCP) TECHNIQUE: Multiplanar multisequence MR imaging of the abdomen was performed both before and after the administration of intravenous contrast. Heavily T2-weighted images of the biliary and pancreatic ducts were obtained, and three-dimensional MRCP images were rendered by post processing. CONTRAST:  184mGADAVIST  GADOBUTROL 1 MMOL/ML IV SOLN COMPARISON:  CT and ultrasound examinations, same date. FINDINGS: Examination is limited due to breathing motion artifact. Lower chest: The lung bases are grossly clear. No pleural or pericardial effusion. Hepatobiliary: Heterogeneous areas of loss of signal intensity on the out of phase gradient echo T1 weighted sequence suggesting fatty infiltration. No worrisome hepatic lesions. No intrahepatic biliary dilatation. Normal caliber and course of the common bile duct. No common bile duct stones are identified. There is gallbladder wall thickening, gallbladder wall enhancement and pericholecystic fluid without definite gallstones. Findings could suggest Lonisha Bobby calculus cholecystitis. Pancreas:  No mass, inflammation or ductal dilatation. Spleen:  Normal size.  No focal lesions. Adrenals/Urinary Tract: The adrenal glands and kidneys are unremarkable. Small renal cysts are noted. No worrisome renal lesions or hydronephrosis. Stomach/Bowel: The stomach, duodenum, visualized small bowel and visualized colon are unremarkable. Vascular/Lymphatic: The aorta and branch vessels are patent. The major venous structures are patent. No mesenteric or retroperitoneal mass or adenopathy. Other:  No ascites or abdominal wall hernia. Musculoskeletal: No significant bony findings. IMPRESSION: 1. Gallbladder wall thickening and enhancement and pericholecystic fluid could suggest acalculus cholecystitis. 2. Normal caliber and course of the common bile duct. No common bile duct stones. 3. Fatty infiltration of the liver but no focal hepatic lesions or intrahepatic biliary  dilatation. 4. No abdominal mass or adenopathy. Electronically Signed   By: Marijo Sanes M.D.   On: 10/13/2021 21:47   NM Hepato W/EF  Result Date: 10/14/2021 CLINICAL DATA:  Three days of right upper quadrant abdominal pain EXAM: NUCLEAR MEDICINE HEPATOBILIARY IMAGING WITH GALLBLADDER EF TECHNIQUE: Sequential images of the abdomen were  obtained out to 60 minutes following intravenous administration of radiopharmaceutical. After oral ingestion of Ensure, gallbladder ejection fraction was determined. At 60 min, normal ejection fraction is greater than 33%. RADIOPHARMACEUTICALS:  7.5 mCi Tc-2m Choletec IV COMPARISON:  MRI October 13, 2021 and ultrasound October 13, 2021. FINDINGS: There is prompt, uniform radiotracer uptake by the liver but with delayed clearance of radiotracer from the hepatic parenchyma. Normal filling of the intrahepatic ducts, common bile duct. Gallbladder activity is visualized, consistent with patency of cystic duct (normal < 60 minutes). Additionally there is normal biliary to bowel transit (normal < 60 minutes), consistent with patent common bile duct. Ensure was administered and the gallbladder appears to empty normally on sequential images. Calculated gallbladder ejection fraction is 100%. (Normal gallbladder ejection fraction with Ensure is greater than 33%.) No evidence of enterogastric biliary reflux. IMPRESSION: 1. Delayed radiotracer clearance from the hepatic parenchyma suggestive of hepatocellular dysfunction. 2.  Normal gallbladder ejection fraction 3.  Patent cystic and common hepatic ducts. Electronically Signed   By: JDahlia BailiffM.D.   On: 10/14/2021 17:57   MR ABDOMEN MRCP W WO CONTAST  Result Date: 10/13/2021 CLINICAL DATA:  Jaundice. EXAM: MRI ABDOMEN WITHOUT AND WITH CONTRAST (INCLUDING MRCP) TECHNIQUE: Multiplanar multisequence MR imaging of the abdomen was performed both before and after the administration of intravenous contrast. Heavily T2-weighted images of the biliary and pancreatic ducts were obtained, and three-dimensional MRCP images were rendered by post processing. CONTRAST:  11mGADAVIST GADOBUTROL 1 MMOL/ML IV SOLN COMPARISON:  CT and ultrasound examinations, same date. FINDINGS: Examination is limited due to breathing motion artifact. Lower chest: The lung bases are grossly clear. No  pleural or pericardial effusion. Hepatobiliary: Heterogeneous areas of loss of signal intensity on the out of phase gradient echo T1 weighted sequence suggesting fatty infiltration. No worrisome hepatic lesions. No intrahepatic biliary dilatation. Normal caliber and course of the common bile duct. No common bile duct stones are identified. There is gallbladder wall thickening, gallbladder wall enhancement and pericholecystic fluid without definite gallstones. Findings could suggest Saaya Procell calculus cholecystitis. Pancreas:  No mass, inflammation or ductal dilatation. Spleen:  Normal size.  No focal lesions. Adrenals/Urinary Tract: The adrenal glands and kidneys are unremarkable. Small renal cysts are noted. No worrisome renal lesions or hydronephrosis. Stomach/Bowel: The stomach, duodenum, visualized small bowel and visualized colon are unremarkable. Vascular/Lymphatic: The aorta and branch vessels are patent. The major venous structures are patent. No mesenteric or retroperitoneal mass or adenopathy. Other:  No ascites or abdominal wall hernia. Musculoskeletal: No significant bony findings. IMPRESSION: 1. Gallbladder wall thickening and enhancement and pericholecystic fluid could suggest acalculus cholecystitis. 2. Normal caliber and course of the common bile duct. No common bile duct stones. 3. Fatty infiltration of the liver but no focal hepatic lesions or intrahepatic biliary dilatation. 4. No abdominal mass or adenopathy. Electronically Signed   By: P.Marijo Sanes.D.   On: 10/13/2021 21:47   USKoreabdomen Limited RUQ (LIVER/GB)  Result Date: 10/13/2021 CLINICAL DATA:  [age]ear old male presenting with abnormal appearance of the gallbladder on recent CT examination. History of acute abdominal pain. EXAM: ULTRASOUND ABDOMEN LIMITED RIGHT UPPER QUADRANT COMPARISON:  No  prior abdominal ultrasound. CT the abdomen and pelvis 10/13/2021. FINDINGS: Gallbladder: No definite calculi are identified within the gallbladder.  Gallbladder is only mildly distended. Gallbladder wall appears heterogeneously thickened and edematous, most evident adjacent to the liver where the edematous wall measures up to 14 mm in diameter. The fundal portion of the gallbladder wall is normal in thickness. No significant volume of pericholecystic fluid. Per report from the sonographer, the patient was administered pain medication, and accurate assessment for sonographic Murphy's sign was not possible on today's examination. Common bile duct: Diameter: 9.1 mm Liver: No focal lesion identified. Heterogeneously increased hepatic echogenicity, indicative of Jeromie Gainor background of heterogeneous hepatic steatosis. Portal vein is patent on color Doppler imaging with normal direction of blood flow towards the liver. Other: None. IMPRESSION: 1. Thickened and edematous gallbladder wall. Given the absence of cholelithiasis, this finding is presumably reflective of intrinsic liver disease, particularly in the setting of hepatic steatosis. No definitive imaging findings to clearly indicate acute cholecystitis are otherwise noted at this time. 2. Hepatic steatosis. 3. Mild dilatation of the common bile duct. No definite intrahepatic biliary ductal dilatation. If there is clinical concern for choledocholithiasis or other cause of biliary tract obstruction, further evaluation with abdominal MRI with and without IV gadolinium with MRCP could be considered. Electronically Signed   By: Vinnie Langton M.D.   On: 10/13/2021 06:08        Scheduled Meds:  enoxaparin (LOVENOX) injection  40 mg Subcutaneous Q24H   insulin aspart  0-9 Units Subcutaneous Q6H   insulin glargine-yfgn  12 Units Subcutaneous QHS   metoprolol succinate  50 mg Oral BID   pantoprazole (PROTONIX) IV  40 mg Intravenous Q24H   sacubitril-valsartan  1 tablet Oral BID   sodium chloride flush  3 mL Intravenous Once   spironolactone  12.5 mg Oral Daily   Continuous Infusions:  piperacillin-tazobactam  3.375 g (10/14/21 1240)   potassium chloride       LOS: 0 days    Time spent: over 30 min    Fayrene Helper, MD [facility]   To contact the attending provider between 7A-7P or the covering provider during after hours 7P-7A, please log into the web site [email or web address] and access using universal [place] password for that web site. If you do not have the password, please call the hospital operator.  10/14/2021, 7:42 PM

## 2021-10-14 NOTE — Progress Notes (Signed)
Inpatient Diabetes Program Recommendations  AACE/ADA: New Consensus Statement on Inpatient Glycemic Control (2015)  Target Ranges:  Prepandial:   less than 140 mg/dL      Peak postprandial:   less than 180 mg/dL (1-2 hours)      Critically ill patients:  140 - 180 mg/dL   Lab Results  Component Value Date   GLUCAP 261 (H) 10/14/2021   HGBA1C 11.3 (H) 10/13/2021    Review of Glycemic Control  Diabetes history: DM2 Outpatient Diabetes medications: Lantus 25 units QAM, Humalog 15-20 TID, Jardiance 25 mg QD, metformin 1000 mg QAM, Ozempic 1 mg weekly Current orders for Inpatient glycemic control: Novolog 0-9 Q6H  HgbA1C - 11.3% Blood sugars consistently above goal of 140-180.  Inpatient Diabetes Program Recommendations:    Add Semglee 12 units QD  Continue to follow.  Thank you. Ailene Ards, RD, LDN, CDE Inpatient Diabetes Coordinator [phone]

## 2021-10-14 NOTE — Assessment & Plan Note (Addendum)
CT with findings concerning for cholecystitis RUQ [place] without findings to suggest cholecystitis MRCP shows possible acalculous cholecystitis HIDA with evidence of hepatocellular dysfunction, patent cystic and common hepatic ducts Appreciate GI and surgery assistance Concern for acalculous cholecystitis vs chronic cholecystitis Surgery planning to follow outpatient to consider elective cholecystectomy - will discharge with abx Follow up with gastroenterology in 3 months

## 2021-10-14 NOTE — Consult Note (Addendum)
Richard Doyle 06/05/1976  [id number].    Requesting MD: Dr. Fayrene Helper Chief Complaint/Reason for Consult: Possible acalculous cholecystitis  HPI: Richard Doyle is a 46 y.o. male with a hx of CHF (last echo EF 55% 2021), CAD ([place] 2020), HTN, HLD, DM2, OSA and obesity who presented to [facility] ED with abdominal pain.   Patient reports yesterday evening (after dinner on Tuesday night) he began having acute onset of epigastric abdominal pain with radiation into his chest.  Pain was constant, rated as a 9/10, and associated with constant nausea, emesis and diarrhea.  He did not try anything for symptoms prior to arrival.  Palpation and movement made his symptoms worse.  No history of similar symptoms in the past.  Denies associated fever, chills, shortness of breath, or urinary symptoms.  Since arrival Tmax 100.33F.  Had some tachycardia in the low 100s which is improved.  Hypertensive without any hypotension.  Intermittently requiring 2L [place] when napping in hospital in setting of history of OSA. WBC wnl. Lipase wnl. Alk phos 219 > 266, AST 491 > 172, ALT 497 > 372, T. Bili 2.3 > 2.5. Hepatitis panel negative. CT A/P w/ gallbladder wall thickening.  RUQ [place] w/ mild distention, no gallstones, no pericholecystic fluid but edematous and thickened gallbladder wall up to 14 mm. CBD 9.1 mm. MRCP w/ GB wall thickening and enhancement w/ pericholecystic fluid that could represent acalculous cholecystitis. No evidence of choledocholithiasis. We were asked to see. HIDA scan pending.   Currently pain remains though it is improved. He has not had oral intake since Tuesday night and has low appetite.   No prior abdominal surgeries He is not on anticoagulation or antiplatelet medication at home He smokes cigarettes intermittently - less than a pack per day No alcohol use No illicit drug use Works for [employer] service  ROS: Review of Systems  Constitutional:  Negative for chills and fever.  Respiratory:   Negative for cough, shortness of breath and wheezing.        Has OSA on CPAP and using supplemental O2 via [place] during admission while napping during the day. No symptoms of SHOB  Cardiovascular:  Negative for chest pain, palpitations and leg swelling.  Gastrointestinal:  Positive for abdominal pain, diarrhea, nausea and vomiting.  Genitourinary: Negative.   All other systems reviewed and are negative.  No family history on file.  Past Medical History:  Diagnosis Date   Arthritis    Asthma    Congestive heart failure (CHF) ([place])    Diabetes mellitus without complication ([place])    Hypertension    Mixed hyperlipidemia     Past Surgical History:  Procedure Laterality Date   KNEE SURGERY      Social History:  reports that he quit smoking about 2 months ago. His smoking use included cigarettes. He uses smokeless tobacco. He reports that he does not currently use alcohol. He reports that he does not use drugs.  Allergies: No Known Allergies  Medications Prior to Admission  Medication Sig Dispense Refill   Aspirin-Acetaminophen-Caffeine (GOODY HEADACHE PO) Take 1 Package by mouth daily as needed (pain, HA).     fluticasone (FLONASE) 50 MCG/ACT nasal spray Place 1 spray into both nostrils daily.     furosemide (LASIX) 20 MG tablet Take 40 mg by mouth daily as needed for fluid.     insulin glargine (LANTUS SOLOSTAR) 100 UNIT/ML Solostar Pen Inject 25 Units into the skin in the morning.     insulin  lispro (HUMALOG KWIKPEN) 200 UNIT/ML KwikPen Inject 15-20 Units into the skin 3 (three) times daily after meals.     JARDIANCE 25 MG TABS tablet Take 25 mg by mouth daily.     meclizine (ANTIVERT) 25 MG tablet Take 25-50 mg by mouth 3 (three) times daily as needed for dizziness.     metFORMIN (GLUCOPHAGE-XR) 500 MG 24 hr tablet Take 1,000 mg by mouth daily with breakfast.     metoprolol succinate (TOPROL-XL) 50 MG 24 hr tablet Take 50 mg by mouth 2 (two) times daily.      neomycin-polymyxin-hydrocortisone (CORTISPORIN) 3.5-10000-1 OTIC suspension Place 3 drops into the right ear 2 (two) times daily.     ofloxacin (FLOXIN) 0.3 % OTIC solution Place 5 drops into the right ear 2 (two) times daily.     sacubitril-valsartan (ENTRESTO) 97-103 MG Take 1 tablet by mouth 2 (two) times daily.     Semaglutide, 1 MG/DOSE, (OZEMPIC, 1 MG/DOSE,) 4 MG/3ML SOPN Inject 1 mg into the skin once a week. Sunday     spironolactone (ALDACTONE) 25 MG tablet Take 12.5 mg by mouth daily.       Physical Exam: Blood pressure 129/84, pulse 81, temperature 98.2 F (36.8 C), temperature source Oral, resp. rate 16, height 6' 2"  (1.88 m), weight (!) 141.5 kg, SpO2 95 %. General: pleasant, WD/WN white male who is laying in bed in NAD HEENT: head is normocephalic, atraumatic.  Sclera are noninjected.  Ears and nose without any masses or lesions.  Mouth is pink and moist. Heart: regular, rate, and rhythm.  Normal s1,s2. No obvious murmurs, gallops, or rubs noted.  Palpable pedal pulses bilaterally  Lungs: CTAB, no wheezes, rhonchi, or rales noted.  Respiratory effort nonlabored on room air Abd: Soft, obese, ND, +BS, no masses, hernias, or organomegaly. Focal moderate TTP in RUQ with + Murphy's sign MS: no BUE/BLE edema, calves soft and nontender Skin: warm and dry with no masses, lesions, or rashes Psych: A&Ox4 with an appropriate affect Neuro: cranial nerves grossly intact, equal strength in BUE/BLE bilaterally, normal speech, thought process intact, moves all extremities, gait not assessed   Results for orders placed or performed during the hospital encounter of 10/13/21 (from the past 48 hour(s))  Lipase, blood     Status: None   Collection Time: 10/13/21 12:10 AM  Result Value Ref Range   Lipase 16 11 - 51 U/L    Comment: Performed at [facility], [address], [place], [place] [address]  Comprehensive metabolic panel     Status: Abnormal   Collection Time:  10/13/21 12:10 AM  Result Value Ref Range   Sodium 138 135 - 145 mmol/L   Potassium 3.7 3.5 - 5.1 mmol/L   Chloride 100 98 - 111 mmol/L   CO2 26 22 - 32 mmol/L   Glucose, Bld 249 (H) 70 - 99 mg/dL    Comment: Glucose reference range applies only to samples taken after fasting for at least 8 hours.   BUN 16 6 - 20 mg/dL   Creatinine, Ser 0.78 0.61 - 1.24 mg/dL   Calcium 9.3 8.9 - 10.3 mg/dL   Total Protein 6.2 (L) 6.5 - 8.1 g/dL   Albumin 4.0 3.5 - 5.0 g/dL   AST 137 (H) 15 - 41 U/L   ALT 98 (H) 0 - 44 U/L   Alkaline Phosphatase 144 (H) 38 - 126 U/L   Total Bilirubin 1.3 (H) 0.3 - 1.2 mg/dL   GFR, Estimated >60 >60 mL/min  Comment: (NOTE) Calculated using the CKD-EPI Creatinine Equation (2021)    Anion gap 12 5 - 15    Comment: Performed at [facility], [place], [place] [address]  Troponin I (High Sensitivity)     Status: None   Collection Time: 10/13/21 12:10 AM  Result Value Ref Range   Troponin I (High Sensitivity) 16 <18 ng/L    Comment: (NOTE) Elevated high sensitivity troponin I (hsTnI) values and significant  changes across serial measurements may suggest ACS but many other  chronic and acute conditions are known to elevate hsTnI results.  Refer to the "Links" section for chest pain algorithms and additional  guidance. Performed at [facility], [address] [place], [place] [address]   CBC with Differential/Platelet     Status: Abnormal   Collection Time: 10/13/21 12:10 AM  Result Value Ref Range   WBC 11.0 (H) 4.0 - 10.5 K/uL   RBC 5.54 4.22 - 5.81 MIL/uL   Hemoglobin 16.2 13.0 - 17.0 g/dL   HCT 47.4 39.0 - 52.0 %   MCV 85.6 80.0 - 100.0 fL   MCH 29.2 26.0 - 34.0 pg   MCHC 34.2 30.0 - 36.0 g/dL   RDW 13.8 11.5 - 15.5 %   Platelets 319 150 - 400 K/uL   nRBC 0.0 0.0 - 0.2 %   Neutrophils Relative % 70 %   Neutro Abs 7.7 1.7 - 7.7 K/uL   Lymphocytes Relative 19 %   Lymphs Abs 2.1 0.7 - 4.0 K/uL    Monocytes Relative 8 %   Monocytes Absolute 0.9 0.1 - 1.0 K/uL   Eosinophils Relative 1 %   Eosinophils Absolute 0.2 0.0 - 0.5 K/uL   Basophils Relative 1 %   Basophils Absolute 0.1 0.0 - 0.1 K/uL   Immature Granulocytes 1 %   Abs Immature Granulocytes 0.07 0.00 - 0.07 K/uL    Comment: Performed at [facility], [place], [place] [address]  Urinalysis, Routine w reflex microscopic Urine, Clean Catch     Status: Abnormal   Collection Time: 10/13/21  2:09 AM  Result Value Ref Range   Color, Urine YELLOW YELLOW   APPearance CLEAR CLEAR   Specific Gravity, Urine 1.046 (H) 1.005 - 1.030   pH 7.0 5.0 - 8.0   Glucose, UA >1,000 (A) NEGATIVE mg/dL   Hgb urine dipstick NEGATIVE NEGATIVE   Bilirubin Urine NEGATIVE NEGATIVE   Ketones, ur NEGATIVE NEGATIVE mg/dL   Protein, ur TRACE (A) NEGATIVE mg/dL   Nitrite NEGATIVE NEGATIVE   Leukocytes,Ua NEGATIVE NEGATIVE   RBC / HPF 0-5 0 - 5 RBC/hpf   WBC, UA 0-5 0 - 5 WBC/hpf    Comment: Performed at [facility], [address] [place], [place] [address]  CBG monitoring, ED     Status: Abnormal   Collection Time: 10/13/21  4:53 AM  Result Value Ref Range   Glucose-Capillary 271 (H) 70 - 99 mg/dL    Comment: Glucose reference range applies only to samples taken after fasting for at least 8 hours.  Resp Panel by RT-PCR (Flu A&B, Covid) Nasopharyngeal Swab     Status: None   Collection Time: 10/13/21  6:23 AM   Specimen: Nasopharyngeal Swab; Nasopharyngeal(NP) swabs in vial transport medium  Result Value Ref Range   SARS Coronavirus 2 by RT PCR NEGATIVE NEGATIVE    Comment: (NOTE) SARS-CoV-2 target nucleic acids are NOT DETECTED.  The SARS-CoV-2 RNA is generally detectable in upper  respiratory specimens during the acute phase of infection. The lowest concentration of SARS-CoV-2 viral copies this assay can detect is 138 copies/mL. A negative result does not preclude SARS-Cov-2 infection and  should not be used as the sole basis for treatment or other patient management decisions. A negative result may occur with  improper specimen collection/handling, submission of specimen other than nasopharyngeal swab, presence of viral mutation(s) within the areas targeted by this assay, and inadequate number of viral copies(<138 copies/mL). A negative result must be combined with clinical observations, patient history, and epidemiological information. The expected result is Negative.  Fact Sheet for Patients:  EntrepreneurPulse.com.au  Fact Sheet for Healthcare Providers:  IncredibleEmployment.be  This test is no t yet approved or cleared by the [place] FDA and  has been authorized for detection and/or diagnosis of SARS-CoV-2 by FDA under an Emergency Use Authorization (EUA). This EUA will remain  in effect (meaning this test can be used) for the duration of the COVID-19 declaration under Section 564(b)(1) of the Act, 21 U.S.C.section 360bbb-3(b)(1), unless the authorization is terminated  or revoked sooner.       Influenza A by PCR NEGATIVE NEGATIVE   Influenza B by PCR NEGATIVE NEGATIVE    Comment: (NOTE) The Xpert Xpress SARS-CoV-2/FLU/RSV plus assay is intended as an aid in the diagnosis of influenza from Nasopharyngeal swab specimens and should not be used as a sole basis for treatment. Nasal washings and aspirates are unacceptable for Xpert Xpress SARS-CoV-2/FLU/RSV testing.  Fact Sheet for Patients: EntrepreneurPulse.com.au  Fact Sheet for Healthcare Providers: IncredibleEmployment.be  This test is not yet approved or cleared by the [place] FDA and has been authorized for detection and/or diagnosis of SARS-CoV-2 by FDA under an Emergency Use Authorization (EUA). This EUA will remain in effect (meaning this test can be used) for the duration of the COVID-19 declaration under Section  564(b)(1) of the Act, 21 U.S.C. section 360bbb-3(b)(1), unless the authorization is terminated or revoked.  Performed at [facility], [address] [place], [place] [address]   CBG monitoring, ED     Status: Abnormal   Collection Time: 10/13/21  7:59 AM  Result Value Ref Range   Glucose-Capillary 277 (H) 70 - 99 mg/dL    Comment: Glucose reference range applies only to samples taken after fasting for at least 8 hours.  Hepatitis panel, acute     Status: None   Collection Time: 10/13/21  8:56 AM  Result Value Ref Range   Hepatitis B Surface Ag NON REACTIVE NON REACTIVE   HCV Ab NON REACTIVE NON REACTIVE    Comment: (NOTE) Nonreactive HCV antibody screen is consistent with no HCV infections,  unless recent infection is suspected or other evidence exists to indicate HCV infection.     Hep A IgM NON REACTIVE NON REACTIVE   Hep B C IgM NON REACTIVE NON REACTIVE    Comment: Performed at [facility], [place] [address], [place], [place] [address]  Comprehensive metabolic panel     Status: Abnormal   Collection Time: 10/13/21  8:56 AM  Result Value Ref Range   Sodium 134 (L) 135 - 145 mmol/L   Potassium 3.8 3.5 - 5.1 mmol/L   Chloride 100 98 - 111 mmol/L   CO2 25 22 - 32 mmol/L   Glucose, Bld 265 (H) 70 - 99 mg/dL    Comment: Glucose reference range applies only to samples taken after fasting for at least 8 hours.   BUN 17 6 -  20 mg/dL   Creatinine, Ser 0.64 0.61 - 1.24 mg/dL   Calcium 8.3 (L) 8.9 - 10.3 mg/dL   Total Protein 7.1 6.5 - 8.1 g/dL   Albumin 3.8 3.5 - 5.0 g/dL   AST 491 (H) 15 - 41 U/L   ALT 497 (H) 0 - 44 U/L   Alkaline Phosphatase 219 (H) 38 - 126 U/L   Total Bilirubin 2.3 (H) 0.3 - 1.2 mg/dL   GFR, Estimated >60 >60 mL/min    Comment: (NOTE) Calculated using the CKD-EPI Creatinine Equation (2021)    Anion gap 9 5 - 15    Comment: Performed at [facility], [place] [address], [place], [place] [address]  CBC     Status:  None   Collection Time: 10/13/21  8:56 AM  Result Value Ref Range   WBC 6.7 4.0 - 10.5 K/uL   RBC 5.26 4.22 - 5.81 MIL/uL   Hemoglobin 15.4 13.0 - 17.0 g/dL   HCT 45.7 39.0 - 52.0 %   MCV 86.9 80.0 - 100.0 fL   MCH 29.3 26.0 - 34.0 pg   MCHC 33.7 30.0 - 36.0 g/dL   RDW 13.9 11.5 - 15.5 %   Platelets 289 150 - 400 K/uL   nRBC 0.0 0.0 - 0.2 %    Comment: Performed at [facility], [place] [address], [place], [place] [address]  Magnesium     Status: None   Collection Time: 10/13/21  8:56 AM  Result Value Ref Range   Magnesium 1.8 1.7 - 2.4 mg/dL    Comment: Performed at [facility], [place] [address], [place], [place] [address]  Hemoglobin A1c     Status: Abnormal   Collection Time: 10/13/21  8:56 AM  Result Value Ref Range   Hgb A1c MFr Bld 11.3 (H) 4.8 - 5.6 %    Comment: (NOTE)         Prediabetes: 5.7 - 6.4         Diabetes: >6.4         Glycemic control for adults with diabetes: <7.0    Mean Plasma Glucose 278 mg/dL    Comment: (NOTE) Performed At: [facility] [place], [place] [id number] Rush Farmer MD OZ:[id number]   Glucose, capillary     Status: Abnormal   Collection Time: 10/13/21 12:00 PM  Result Value Ref Range   Glucose-Capillary 221 (H) 70 - 99 mg/dL    Comment: Glucose reference range applies only to samples taken after fasting for at least 8 hours.  Glucose, capillary     Status: Abnormal   Collection Time: 10/13/21  5:45 PM  Result Value Ref Range   Glucose-Capillary 214 (H) 70 - 99 mg/dL    Comment: Glucose reference range applies only to samples taken after fasting for at least 8 hours.  Glucose, capillary     Status: Abnormal   Collection Time: 10/13/21 11:51 PM  Result Value Ref Range   Glucose-Capillary 258 (H) 70 - 99 mg/dL    Comment: Glucose reference range applies only to samples taken after fasting for at least 8 hours.  HIV Antibody (routine testing w rflx)     Status: None    Collection Time: 10/14/21  4:28 AM  Result Value Ref Range   HIV Screen 4th Generation wRfx Non Reactive Non Reactive    Comment: Performed at [facility], [place] [address] [place], [place] [address]  Comprehensive metabolic panel  Status: Abnormal   Collection Time: 10/14/21  4:28 AM  Result Value Ref Range   Sodium 131 (L) 135 - 145 mmol/L   Potassium 3.1 (L) 3.5 - 5.1 mmol/L    Comment: DELTA CHECK NOTED   Chloride 100 98 - 111 mmol/L   CO2 23 22 - 32 mmol/L   Glucose, Bld 237 (H) 70 - 99 mg/dL    Comment: Glucose reference range applies only to samples taken after fasting for at least 8 hours.   BUN 11 6 - 20 mg/dL   Creatinine, Ser 0.72 0.61 - 1.24 mg/dL   Calcium 8.0 (L) 8.9 - 10.3 mg/dL   Total Protein 6.5 6.5 - 8.1 g/dL   Albumin 3.7 3.5 - 5.0 g/dL   AST 172 (H) 15 - 41 U/L   ALT 372 (H) 0 - 44 U/L   Alkaline Phosphatase 266 (H) 38 - 126 U/L   Total Bilirubin 2.5 (H) 0.3 - 1.2 mg/dL   GFR, Estimated >60 >60 mL/min    Comment: (NOTE) Calculated using the CKD-EPI Creatinine Equation (2021)    Anion gap 8 5 - 15    Comment: Performed at [facility], [place] [address], [place], [place] [address]  CBC     Status: None   Collection Time: 10/14/21  4:28 AM  Result Value Ref Range   WBC 7.0 4.0 - 10.5 K/uL   RBC 5.55 4.22 - 5.81 MIL/uL   Hemoglobin 16.4 13.0 - 17.0 g/dL   HCT 48.2 39.0 - 52.0 %   MCV 86.8 80.0 - 100.0 fL   MCH 29.5 26.0 - 34.0 pg   MCHC 34.0 30.0 - 36.0 g/dL   RDW 13.9 11.5 - 15.5 %   Platelets 250 150 - 400 K/uL   nRBC 0.0 0.0 - 0.2 %    Comment: Performed at [facility], [place] [address], [place], [place] [address]  Glucose, capillary     Status: Abnormal   Collection Time: 10/14/21  5:44 AM  Result Value Ref Range   Glucose-Capillary 261 (H) 70 - 99 mg/dL    Comment: Glucose reference range applies only to samples taken after fasting for at least 8 hours.  Glucose, capillary     Status: Abnormal    Collection Time: 10/14/21 12:28 PM  Result Value Ref Range   Glucose-Capillary 194 (H) 70 - 99 mg/dL    Comment: Glucose reference range applies only to samples taken after fasting for at least 8 hours.   CT ABDOMEN PELVIS W CONTRAST  Result Date: 10/13/2021 CLINICAL DATA:  Acute abdominal pain EXAM: CT ABDOMEN AND PELVIS WITH CONTRAST TECHNIQUE: Multidetector CT imaging of the abdomen and pelvis was performed using the standard protocol following bolus administration of intravenous contrast. RADIATION DOSE REDUCTION: This exam was performed according to the departmental dose-optimization program which includes automated exposure control, adjustment of the mA and/or kV according to patient size and/or use of iterative reconstruction technique. CONTRAST:  135m OMNIPAQUE IOHEXOL 300 MG/ML  SOLN COMPARISON:  None. FINDINGS: Lower chest: No acute abnormality. Hepatobiliary: Fatty infiltration of the liver is noted. Gallbladder is well distended. Gallbladder wall thickening is noted suspicious for cholecystitis. Pancreas: Unremarkable. No pancreatic ductal dilatation or surrounding inflammatory changes. Spleen: Normal in size without focal abnormality. Adrenals/Urinary Tract: Adrenal glands are within normal limits. Kidneys demonstrate a normal enhancement pattern bilaterally. Small renal cysts are noted bilaterally. Nonobstructing stones are noted in the right kidney the largest of which measures 3 mm in  the lower pole. Tiny nonobstructing left renal stone is noted as well. The ureters are within normal limits. Bladder is well distended. Stomach/Bowel: Appendix is well visualized and within normal limits. No obstructive or inflammatory changes of the colon are noted. Stomach is within normal limits. Small bowel is unremarkable. Vascular/Lymphatic: Aortic atherosclerosis. No enlarged abdominal or pelvic lymph nodes. Reproductive: Prostate is unremarkable. Other: No abdominal wall hernia or abnormality. No  abdominopelvic ascites. Musculoskeletal: Degenerative changes of the lumbar spine are noted. IMPRESSION: Gallbladder wall thickening suspicious for cholecystitis. Ultrasound is recommended for further evaluation. Nonobstructing renal calculi bilaterally. Appendix is well visualized and within normal limits. Electronically Signed   By: Inez Catalina M.D.   On: 10/13/2021 01:37   MR 3D Recon At Scanner  Result Date: 10/13/2021 CLINICAL DATA:  Jaundice. EXAM: MRI ABDOMEN WITHOUT AND WITH CONTRAST (INCLUDING MRCP) TECHNIQUE: Multiplanar multisequence MR imaging of the abdomen was performed both before and after the administration of intravenous contrast. Heavily T2-weighted images of the biliary and pancreatic ducts were obtained, and three-dimensional MRCP images were rendered by post processing. CONTRAST:  73m GADAVIST GADOBUTROL 1 MMOL/ML IV SOLN COMPARISON:  CT and ultrasound examinations, same date. FINDINGS: Examination is limited due to breathing motion artifact. Lower chest: The lung bases are grossly clear. No pleural or pericardial effusion. Hepatobiliary: Heterogeneous areas of loss of signal intensity on the out of phase gradient echo T1 weighted sequence suggesting fatty infiltration. No worrisome hepatic lesions. No intrahepatic biliary dilatation. Normal caliber and course of the common bile duct. No common bile duct stones are identified. There is gallbladder wall thickening, gallbladder wall enhancement and pericholecystic fluid without definite gallstones. Findings could suggest a calculus cholecystitis. Pancreas:  No mass, inflammation or ductal dilatation. Spleen:  Normal size.  No focal lesions. Adrenals/Urinary Tract: The adrenal glands and kidneys are unremarkable. Small renal cysts are noted. No worrisome renal lesions or hydronephrosis. Stomach/Bowel: The stomach, duodenum, visualized small bowel and visualized colon are unremarkable. Vascular/Lymphatic: The aorta and branch vessels are  patent. The major venous structures are patent. No mesenteric or retroperitoneal mass or adenopathy. Other:  No ascites or abdominal wall hernia. Musculoskeletal: No significant bony findings. IMPRESSION: 1. Gallbladder wall thickening and enhancement and pericholecystic fluid could suggest acalculus cholecystitis. 2. Normal caliber and course of the common bile duct. No common bile duct stones. 3. Fatty infiltration of the liver but no focal hepatic lesions or intrahepatic biliary dilatation. 4. No abdominal mass or adenopathy. Electronically Signed   By: PMarijo SanesM.D.   On: 10/13/2021 21:47   MR ABDOMEN MRCP W WO CONTAST  Result Date: 10/13/2021 CLINICAL DATA:  Jaundice. EXAM: MRI ABDOMEN WITHOUT AND WITH CONTRAST (INCLUDING MRCP) TECHNIQUE: Multiplanar multisequence MR imaging of the abdomen was performed both before and after the administration of intravenous contrast. Heavily T2-weighted images of the biliary and pancreatic ducts were obtained, and three-dimensional MRCP images were rendered by post processing. CONTRAST:  136mGADAVIST GADOBUTROL 1 MMOL/ML IV SOLN COMPARISON:  CT and ultrasound examinations, same date. FINDINGS: Examination is limited due to breathing motion artifact. Lower chest: The lung bases are grossly clear. No pleural or pericardial effusion. Hepatobiliary: Heterogeneous areas of loss of signal intensity on the out of phase gradient echo T1 weighted sequence suggesting fatty infiltration. No worrisome hepatic lesions. No intrahepatic biliary dilatation. Normal caliber and course of the common bile duct. No common bile duct stones are identified. There is gallbladder wall thickening, gallbladder wall enhancement and pericholecystic fluid without definite gallstones. Findings  could suggest a calculus cholecystitis. Pancreas:  No mass, inflammation or ductal dilatation. Spleen:  Normal size.  No focal lesions. Adrenals/Urinary Tract: The adrenal glands and kidneys are unremarkable.  Small renal cysts are noted. No worrisome renal lesions or hydronephrosis. Stomach/Bowel: The stomach, duodenum, visualized small bowel and visualized colon are unremarkable. Vascular/Lymphatic: The aorta and branch vessels are patent. The major venous structures are patent. No mesenteric or retroperitoneal mass or adenopathy. Other:  No ascites or abdominal wall hernia. Musculoskeletal: No significant bony findings. IMPRESSION: 1. Gallbladder wall thickening and enhancement and pericholecystic fluid could suggest acalculus cholecystitis. 2. Normal caliber and course of the common bile duct. No common bile duct stones. 3. Fatty infiltration of the liver but no focal hepatic lesions or intrahepatic biliary dilatation. 4. No abdominal mass or adenopathy. Electronically Signed   By: Marijo Sanes M.D.   On: 10/13/2021 21:47   US Abdomen Limited RUQ (LIVER/GB)  Result Date: 10/13/2021 CLINICAL DATA:  46 year old male presenting with abnormal appearance of the gallbladder on recent CT examination. History of acute abdominal pain. EXAM: ULTRASOUND ABDOMEN LIMITED RIGHT UPPER QUADRANT COMPARISON:  No prior abdominal ultrasound. CT the abdomen and pelvis 10/13/2021. FINDINGS: Gallbladder: No definite calculi are identified within the gallbladder. Gallbladder is only mildly distended. Gallbladder wall appears heterogeneously thickened and edematous, most evident adjacent to the liver where the edematous wall measures up to 14 mm in diameter. The fundal portion of the gallbladder wall is normal in thickness. No significant volume of pericholecystic fluid. Per report from the sonographer, the patient was administered pain medication, and accurate assessment for sonographic Murphy's sign was not possible on today's examination. Common bile duct: Diameter: 9.1 mm Liver: No focal lesion identified. Heterogeneously increased hepatic echogenicity, indicative of a background of heterogeneous hepatic steatosis. Portal vein is  patent on color Doppler imaging with normal direction of blood flow towards the liver. Other: None. IMPRESSION: 1. Thickened and edematous gallbladder wall. Given the absence of cholelithiasis, this finding is presumably reflective of intrinsic liver disease, particularly in the setting of hepatic steatosis. No definitive imaging findings to clearly indicate acute cholecystitis are otherwise noted at this time. 2. Hepatic steatosis. 3. Mild dilatation of the common bile duct. No definite intrahepatic biliary ductal dilatation. If there is clinical concern for choledocholithiasis or other cause of biliary tract obstruction, further evaluation with abdominal MRI with and without IV gadolinium with MRCP could be considered. Electronically Signed   By: Vinnie Langton M.D.   On: 10/13/2021 06:08    Anti-infectives (From admission, onward)    Start     Dose/Rate Route Frequency Ordered Stop   10/13/21 1200  piperacillin-tazobactam (ZOSYN) IVPB 3.375 g        3.375 g 12.5 mL/hr over 240 Minutes Intravenous Every 8 hours 10/13/21 1129         Assessment/Plan Epigastric and RUQ abdominal pain Elevated LFTs Possible acalculous cholecystitis on MRCP -Patient imaging with possible acalculous cholecystitis.  Await HIDA to confirm vs rule out Cholecystitis. He is already on abx which should appropriately cover. Further recs to follow pending results of scan.   FEN - NPO for HIDA. IVF per [facility] VTE - SCDs, okay for chemical prophylaxis from our standpoint ID - Zosyn  CHF (last echo EF 55% 2021) CAD (LHC 2020) HTN HLD DM2   I reviewed hospitalist notes, last 24 h vitals and pain scores, last 48 h intake and output, last 24 h labs and trends, and last 24 h imaging results.  This care required high  level of medical decision making.   Richard Miu, [facility] 10/14/2021, 1:10 PM Please see Amion for pager number during day hours 7:00am-4:30pm

## 2021-10-14 NOTE — Assessment & Plan Note (Addendum)
Entresto, metop, spiro, resume lasix

## 2021-10-14 NOTE — Assessment & Plan Note (Addendum)
Negative acute hepatitis panel MRCP with normal caliber and course of CBD, does have fatty infiltration of liver LFT's improved Follow EBV, CMV

## 2021-10-14 NOTE — Assessment & Plan Note (Addendum)
Echo 11/2019 with EF >55%, NICM, had non obstructive CAD on LHC 03/2019 Continue metop, spironolactone, and entresto and lasix

## 2021-10-14 NOTE — Assessment & Plan Note (Signed)
Replace and follow. ?

## 2021-10-14 NOTE — Hospital Course (Addendum)
46 yo with hx heart failure, OA, T2DM, hypertension, dyslipidemia, obesity presenting with RUQ abdominal discomfort and elevated LFT's.  MRCP was concerning for possible acalculous cholecystitis, but had normal HIDA scan.  Still concern for acalculous cholecystitsi vs chronic cholecystitis.  Plan is for outpatient follow up with surgery on abx to consider cholecystectomy outpatient.    See below for additional details

## 2021-10-15 DIAGNOSIS — Z20822 Contact with and (suspected) exposure to covid-19: Secondary | ICD-10-CM | POA: Diagnosis present

## 2021-10-15 DIAGNOSIS — R1011 Right upper quadrant pain: Secondary | ICD-10-CM

## 2021-10-15 DIAGNOSIS — I2510 Atherosclerotic heart disease of native coronary artery without angina pectoris: Secondary | ICD-10-CM | POA: Diagnosis present

## 2021-10-15 DIAGNOSIS — Z7984 Long term (current) use of oral hypoglycemic drugs: Secondary | ICD-10-CM | POA: Diagnosis not present

## 2021-10-15 DIAGNOSIS — R197 Diarrhea, unspecified: Secondary | ICD-10-CM | POA: Diagnosis present

## 2021-10-15 DIAGNOSIS — J45909 Unspecified asthma, uncomplicated: Secondary | ICD-10-CM | POA: Diagnosis present

## 2021-10-15 DIAGNOSIS — K760 Fatty (change of) liver, not elsewhere classified: Secondary | ICD-10-CM | POA: Diagnosis present

## 2021-10-15 DIAGNOSIS — E876 Hypokalemia: Secondary | ICD-10-CM | POA: Diagnosis present

## 2021-10-15 DIAGNOSIS — K828 Other specified diseases of gallbladder: Secondary | ICD-10-CM | POA: Diagnosis present

## 2021-10-15 DIAGNOSIS — F17210 Nicotine dependence, cigarettes, uncomplicated: Secondary | ICD-10-CM | POA: Diagnosis present

## 2021-10-15 DIAGNOSIS — Z79899 Other long term (current) drug therapy: Secondary | ICD-10-CM | POA: Diagnosis not present

## 2021-10-15 DIAGNOSIS — K810 Acute cholecystitis: Secondary | ICD-10-CM | POA: Diagnosis present

## 2021-10-15 DIAGNOSIS — G4733 Obstructive sleep apnea (adult) (pediatric): Secondary | ICD-10-CM | POA: Diagnosis present

## 2021-10-15 DIAGNOSIS — Z794 Long term (current) use of insulin: Secondary | ICD-10-CM | POA: Diagnosis not present

## 2021-10-15 DIAGNOSIS — K7689 Other specified diseases of liver: Secondary | ICD-10-CM | POA: Diagnosis present

## 2021-10-15 DIAGNOSIS — E119 Type 2 diabetes mellitus without complications: Secondary | ICD-10-CM | POA: Diagnosis present

## 2021-10-15 DIAGNOSIS — M1990 Unspecified osteoarthritis, unspecified site: Secondary | ICD-10-CM | POA: Diagnosis present

## 2021-10-15 DIAGNOSIS — K811 Chronic cholecystitis: Secondary | ICD-10-CM | POA: Diagnosis present

## 2021-10-15 DIAGNOSIS — E782 Mixed hyperlipidemia: Secondary | ICD-10-CM | POA: Diagnosis present

## 2021-10-15 DIAGNOSIS — I5042 Chronic combined systolic (congestive) and diastolic (congestive) heart failure: Secondary | ICD-10-CM | POA: Diagnosis present

## 2021-10-15 DIAGNOSIS — I110 Hypertensive heart disease with heart failure: Secondary | ICD-10-CM | POA: Diagnosis present

## 2021-10-15 LAB — CBC WITH DIFFERENTIAL/PLATELET
Abs Immature Granulocytes: 0.04 10*3/uL (ref 0.00–0.07)
Basophils Absolute: 0 10*3/uL (ref 0.0–0.1)
Basophils Relative: 1 %
Eosinophils Absolute: 0.3 10*3/uL (ref 0.0–0.5)
Eosinophils Relative: 5 %
HCT: 47.2 % (ref 39.0–52.0)
Hemoglobin: 15.8 g/dL (ref 13.0–17.0)
Immature Granulocytes: 1 %
Lymphocytes Relative: 17 %
Lymphs Abs: 1 10*3/uL (ref 0.7–4.0)
MCH: 29.3 pg (ref 26.0–34.0)
MCHC: 33.5 g/dL (ref 30.0–36.0)
MCV: 87.4 fL (ref 80.0–100.0)
Monocytes Absolute: 0.6 10*3/uL (ref 0.1–1.0)
Monocytes Relative: 9 %
Neutro Abs: 4.3 10*3/uL (ref 1.7–7.7)
Neutrophils Relative %: 67 %
Platelets: 244 10*3/uL (ref 150–400)
RBC: 5.4 MIL/uL (ref 4.22–5.81)
RDW: 13.9 % (ref 11.5–15.5)
WBC: 6.3 10*3/uL (ref 4.0–10.5)
nRBC: 0 % (ref 0.0–0.2)

## 2021-10-15 LAB — COMPREHENSIVE METABOLIC PANEL
ALT: 220 U/L — ABNORMAL HIGH (ref 0–44)
AST: 55 U/L — ABNORMAL HIGH (ref 15–41)
Albumin: 3.2 g/dL — ABNORMAL LOW (ref 3.5–5.0)
Alkaline Phosphatase: 223 U/L — ABNORMAL HIGH (ref 38–126)
Anion gap: 8 (ref 5–15)
BUN: 13 mg/dL (ref 6–20)
CO2: 23 mmol/L (ref 22–32)
Calcium: 7.9 mg/dL — ABNORMAL LOW (ref 8.9–10.3)
Chloride: 102 mmol/L (ref 98–111)
Creatinine, Ser: 0.74 mg/dL (ref 0.61–1.24)
GFR, Estimated: >60 mL/min (ref >60)
Glucose, Bld: 166 mg/dL — ABNORMAL HIGH (ref 70–99)
Potassium: 3.2 mmol/L — ABNORMAL LOW (ref 3.5–5.1)
Sodium: 133 mmol/L — ABNORMAL LOW (ref 135–145)
Total Bilirubin: 1.4 mg/dL — ABNORMAL HIGH (ref 0.3–1.2)
Total Protein: 6 g/dL — ABNORMAL LOW (ref 6.5–8.1)

## 2021-10-15 LAB — GLUCOSE, CAPILLARY
Glucose-Capillary: 154 mg/dL — ABNORMAL HIGH (ref 70–99)
Glucose-Capillary: 241 mg/dL — ABNORMAL HIGH (ref 70–99)
Glucose-Capillary: 161 mg/dL — ABNORMAL HIGH (ref 70–99)

## 2021-10-15 LAB — MAGNESIUM: Magnesium: 2 mg/dL (ref 1.7–2.4)

## 2021-10-15 LAB — PHOSPHORUS: Phosphorus: 2.7 mg/dL (ref 2.5–4.6)

## 2021-10-15 MED ORDER — POTASSIUM CHLORIDE CRYS ER 20 MEQ PO TBCR
40.0000 meq | EXTENDED_RELEASE_TABLET | Freq: Once | ORAL | Status: AC
  Administered 2021-10-15: 40 meq via ORAL
  Filled 2021-10-15: qty 2

## 2021-10-15 NOTE — Consult Note (Signed)
Referring Provider: Elodia Florence., MD Primary Care Physician:  Curlene Labrum, MD Primary Gastroenterologist:  Joneen Boers  Reason for Consultation:  Elevated LFTs, RUQ pain  HPI: Richard Doyle is a 46 y.o. male with history of heart failure, T2DM, hypertension, dyslipidemia, obesity presenting with RUQ abdominal discomfort and elevated LFT's.  CT findings were concerning for cholecystitis.  RUQ [place] did not suggest cholecystitis.  MRCP showed possible acalculous cholecystitis.  HIDA shows evidence of hepatocellular dysfunction, patent cystic and common hepatic ducts.  Surgery is not recommending cholecystectomy at this time.  Patient states he had steak and mushrooms Tuesday night for dinner, then at 11 PM started having nausea, vomiting, diarrhea, and severe RUQ pain.  Went to emergency department and admitted for further evaluation.  Imaging findings as above.  Patient denies prior similar episodes, but does report occasional RUQ pain that comes on at random every 2 or 3 months and resolves in a few seconds, ongoing for the last 2 to 3 years.  No clear association with food.  Patient states his pain has improved but is still present.  He is tolerating a clear liquid diet and will advance to full liquid diet.  He reports 1 episode of diarrhea since admission.  Denies further episodes of vomiting but has had dry heaving.  No nausea or vomiting today. Denies hematemesis, denies melena or hematochezia.  Has not had a normal bowel movement since being in the hospital.  Patient denies family history of GI malignancy or disease.  Has never had an EGD or colonoscopy.  States he drinks alcohol rarely, smokes 2 packs of cigarettes per week.   Patient has type 2 diabetes and is on Ozempic.  States he recently stopped taking and then restarted on the higher dose.  This was a few days before this episode began.  Past Medical History:  Diagnosis Date   Arthritis    Asthma    Congestive heart  failure (CHF) ([place])    Diabetes mellitus without complication ([place])    Hypertension    Mixed hyperlipidemia     Past Surgical History:  Procedure Laterality Date   KNEE SURGERY      Prior to Admission medications   Medication Sig Start Date End Date Taking? Authorizing Provider  Aspirin-Acetaminophen-Caffeine (GOODY HEADACHE PO) Take 1 Package by mouth daily as needed (pain, HA).   Yes [provider]  fluticasone (FLONASE) 50 MCG/ACT nasal spray Place 1 spray into both nostrils daily. 09/30/21  Yes [provider]  furosemide (LASIX) 20 MG tablet Take 40 mg by mouth daily as needed for fluid. 09/28/21  Yes [provider]  insulin glargine (LANTUS SOLOSTAR) 100 UNIT/ML Solostar Pen Inject 25 Units into the skin in the morning. 12/18/19  Yes [provider]  insulin lispro (HUMALOG KWIKPEN) 200 UNIT/ML KwikPen Inject 15-20 Units into the skin 3 (three) times daily after meals. 07/21/20  Yes [provider]  JARDIANCE 25 MG TABS tablet Take 25 mg by mouth daily. 05/08/21  Yes [provider]  meclizine (ANTIVERT) 25 MG tablet Take 25-50 mg by mouth 3 (three) times daily as needed for dizziness. 09/13/21  Yes [provider]  metFORMIN (GLUCOPHAGE-XR) 500 MG 24 hr tablet Take 1,000 mg by mouth daily with breakfast. 07/22/20  Yes [provider]  metoprolol succinate (TOPROL-XL) 50 MG 24 hr tablet Take 50 mg by mouth 2 (two) times daily. 09/28/21  Yes [provider]  neomycin-polymyxin-hydrocortisone (CORTISPORIN) 3.5-10000-1 OTIC suspension Place 3 drops  into the right ear 2 (two) times daily. 09/30/21  Yes [provider]  ofloxacin (FLOXIN) 0.3 % OTIC solution Place 5 drops into the right ear 2 (two) times daily. 10/12/21  Yes [provider]  sacubitril-valsartan (ENTRESTO) 97-103 MG Take 1 tablet by mouth 2 (two) times daily. 07/04/19  Yes [provider]  Semaglutide, 1 MG/DOSE, (OZEMPIC, 1  MG/DOSE,) 4 MG/3ML SOPN Inject 1 mg into the skin once a week. Sunday   Yes [provider]  spironolactone (ALDACTONE) 25 MG tablet Take 12.5 mg by mouth daily. 09/28/21  Yes [provider]    Scheduled Meds:  enoxaparin (LOVENOX) injection  70 mg Subcutaneous Q24H   insulin aspart  0-9 Units Subcutaneous Q6H   insulin glargine-yfgn  12 Units Subcutaneous QHS   metoprolol succinate  50 mg Oral BID   pantoprazole (PROTONIX) IV  40 mg Intravenous Q24H   sacubitril-valsartan  1 tablet Oral BID   sodium chloride flush  3 mL Intravenous Once   spironolactone  12.5 mg Oral Daily   Continuous Infusions:  piperacillin-tazobactam 3.375 g (10/15/21 1106)   PRN Meds:.acetaminophen **OR** acetaminophen, HYDROmorphone (DILAUDID) injection, ondansetron **OR** ondansetron (ZOFRAN) IV  Allergies as of 10/12/2021   (No Known Allergies)    No family history on file.  Social History   Socioeconomic History   Marital status: Married    Spouse name: Not on file   Number of children: Not on file   Years of education: Not on file   Highest education level: Not on file  Occupational History   Not on file  Tobacco Use   Smoking status: Former    Types: Cigarettes    Quit date: 07/22/2021    Years since quitting: 0.2   Smokeless tobacco: Current  Substance and Sexual Activity   Alcohol use: Not Currently   Drug use: Never   Sexual activity: Not on file  Other Topics Concern   Not on file  Social History Narrative   Not on file   Social Determinants of Health   Financial Resource Strain: Not on file  Food Insecurity: Not on file  Transportation Needs: Not on file  Physical Activity: Not on file  Stress: Not on file  Social Connections: Not on file  Intimate Partner Violence: Not on file    Review of Systems: Review of Systems  Constitutional:  Negative for chills and fever.  HENT:  Negative for sinus pain.   Eyes:  Negative for discharge and redness.   Respiratory:  Negative for shortness of breath, wheezing and stridor.   Cardiovascular:  Negative for chest pain and palpitations.  Gastrointestinal:  Positive for abdominal pain, diarrhea, nausea and vomiting. Negative for blood in stool, constipation, heartburn and melena.  Genitourinary:  Negative for dysuria and urgency.  Musculoskeletal:  Negative for falls and joint pain.  Skin:  Negative for itching and rash.  Neurological:  Negative for seizures and loss of consciousness.  Endo/Heme/Allergies:  Negative for environmental allergies. Does not bruise/bleed easily.  Psychiatric/Behavioral:  Negative for memory loss. The patient is not nervous/anxious.     Physical Exam: Physical Exam Constitutional:      General: He is not in acute distress.    Appearance: He is obese.  HENT:     Head: Normocephalic and atraumatic.     Right Ear: External ear normal.     Left Ear: External ear normal.     Nose: Nose normal.     Mouth/Throat:  Mouth: Mucous membranes are moist.     Pharynx: Oropharynx is clear.  Eyes:     General: No scleral icterus.    Extraocular Movements: Extraocular movements intact.     Conjunctiva/sclera: Conjunctivae normal.  Cardiovascular:     Rate and Rhythm: Normal rate and regular rhythm.     Pulses: Normal pulses.     Heart sounds: Normal heart sounds.  Pulmonary:     Effort: Pulmonary effort is normal.     Breath sounds: Normal breath sounds.  Abdominal:     General: Bowel sounds are normal. There is no distension.     Palpations: Abdomen is soft.     Tenderness: There is no abdominal tenderness.  Musculoskeletal:     Cervical back: Normal range of motion and neck supple.     Right lower leg: No edema.     Left lower leg: No edema.  Skin:    General: Skin is warm and dry.  Neurological:     General: No focal deficit present.     Mental Status: He is alert and oriented to person, place, and time.  Psychiatric:        Mood and Affect: Mood normal.         Behavior: Behavior normal.     Vital signs: Vitals:   10/15/21 0556 10/15/21 0950  BP: 135/87 135/81  Pulse: 72 78  Resp: 16 18  Temp: 97.7 F (36.5 C) 97.7 F (36.5 C)  SpO2: 98% 97%   Last BM Date : 10/13/21    GI:  Lab Results: Recent Labs    10/13/21 0856 10/14/21 0428 10/15/21 0423  WBC 6.7 7.0 6.3  HGB 15.4 16.4 15.8  HCT 45.7 48.2 47.2  PLT 289 250 244   BMET Recent Labs    10/13/21 0856 10/14/21 0428 10/15/21 0423  NA 134* 131* 133*  K 3.8 3.1* 3.2*  CL 100 100 102  CO2 25 23 23   GLUCOSE 265* 237* 166*  BUN 17 11 13   CREATININE 0.64 0.72 0.74  CALCIUM 8.3* 8.0* 7.9*   LFT Recent Labs    10/15/21 0423  PROT 6.0*  ALBUMIN 3.2*  AST 55*  ALT 220*  ALKPHOS 223*  BILITOT 1.4*   PT/INR No results for input(s): LABPROT, INR in the last 72 hours.   Studies/Results: MR 3D Recon At Scanner  Result Date: 10/13/2021 CLINICAL DATA:  Jaundice. EXAM: MRI ABDOMEN WITHOUT AND WITH CONTRAST (INCLUDING MRCP) TECHNIQUE: Multiplanar multisequence MR imaging of the abdomen was performed both before and after the administration of intravenous contrast. Heavily T2-weighted images of the biliary and pancreatic ducts were obtained, and three-dimensional MRCP images were rendered by post processing. CONTRAST:  40m GADAVIST GADOBUTROL 1 MMOL/ML IV SOLN COMPARISON:  CT and ultrasound examinations, same date. FINDINGS: Examination is limited due to breathing motion artifact. Lower chest: The lung bases are grossly clear. No pleural or pericardial effusion. Hepatobiliary: Heterogeneous areas of loss of signal intensity on the out of phase gradient echo T1 weighted sequence suggesting fatty infiltration. No worrisome hepatic lesions. No intrahepatic biliary dilatation. Normal caliber and course of the common bile duct. No common bile duct stones are identified. There is gallbladder wall thickening, gallbladder wall enhancement and pericholecystic fluid without definite  gallstones. Findings could suggest a calculus cholecystitis. Pancreas:  No mass, inflammation or ductal dilatation. Spleen:  Normal size.  No focal lesions. Adrenals/Urinary Tract: The adrenal glands and kidneys are unremarkable. Small renal cysts are noted. No worrisome renal lesions  or hydronephrosis. Stomach/Bowel: The stomach, duodenum, visualized small bowel and visualized colon are unremarkable. Vascular/Lymphatic: The aorta and branch vessels are patent. The major venous structures are patent. No mesenteric or retroperitoneal mass or adenopathy. Other:  No ascites or abdominal wall hernia. Musculoskeletal: No significant bony findings. IMPRESSION: 1. Gallbladder wall thickening and enhancement and pericholecystic fluid could suggest acalculus cholecystitis. 2. Normal caliber and course of the common bile duct. No common bile duct stones. 3. Fatty infiltration of the liver but no focal hepatic lesions or intrahepatic biliary dilatation. 4. No abdominal mass or adenopathy. Electronically Signed   By: Marijo Sanes M.D.   On: 10/13/2021 21:47   NM Hepato W/EF  Result Date: 10/14/2021 CLINICAL DATA:  Three days of right upper quadrant abdominal pain EXAM: NUCLEAR MEDICINE HEPATOBILIARY IMAGING WITH GALLBLADDER EF TECHNIQUE: Sequential images of the abdomen were obtained out to 60 minutes following intravenous administration of radiopharmaceutical. After oral ingestion of Ensure, gallbladder ejection fraction was determined. At 60 min, normal ejection fraction is greater than 33%. RADIOPHARMACEUTICALS:  7.5 mCi Tc-48m Choletec IV COMPARISON:  MRI October 13, 2021 and ultrasound October 13, 2021. FINDINGS: There is prompt, uniform radiotracer uptake by the liver but with delayed clearance of radiotracer from the hepatic parenchyma. Normal filling of the intrahepatic ducts, common bile duct. Gallbladder activity is visualized, consistent with patency of cystic duct (normal < 60 minutes). Additionally there is  normal biliary to bowel transit (normal < 60 minutes), consistent with patent common bile duct. Ensure was administered and the gallbladder appears to empty normally on sequential images. Calculated gallbladder ejection fraction is 100%. (Normal gallbladder ejection fraction with Ensure is greater than 33%.) No evidence of enterogastric biliary reflux. IMPRESSION: 1. Delayed radiotracer clearance from the hepatic parenchyma suggestive of hepatocellular dysfunction. 2.  Normal gallbladder ejection fraction 3.  Patent cystic and common hepatic ducts. Electronically Signed   By: JDahlia BailiffM.D.   On: 10/14/2021 17:57   MR ABDOMEN MRCP W WO CONTAST  Result Date: 10/13/2021 CLINICAL DATA:  Jaundice. EXAM: MRI ABDOMEN WITHOUT AND WITH CONTRAST (INCLUDING MRCP) TECHNIQUE: Multiplanar multisequence MR imaging of the abdomen was performed both before and after the administration of intravenous contrast. Heavily T2-weighted images of the biliary and pancreatic ducts were obtained, and three-dimensional MRCP images were rendered by post processing. CONTRAST:  173mGADAVIST GADOBUTROL 1 MMOL/ML IV SOLN COMPARISON:  CT and ultrasound examinations, same date. FINDINGS: Examination is limited due to breathing motion artifact. Lower chest: The lung bases are grossly clear. No pleural or pericardial effusion. Hepatobiliary: Heterogeneous areas of loss of signal intensity on the out of phase gradient echo T1 weighted sequence suggesting fatty infiltration. No worrisome hepatic lesions. No intrahepatic biliary dilatation. Normal caliber and course of the common bile duct. No common bile duct stones are identified. There is gallbladder wall thickening, gallbladder wall enhancement and pericholecystic fluid without definite gallstones. Findings could suggest a calculus cholecystitis. Pancreas:  No mass, inflammation or ductal dilatation. Spleen:  Normal size.  No focal lesions. Adrenals/Urinary Tract: The adrenal glands and  kidneys are unremarkable. Small renal cysts are noted. No worrisome renal lesions or hydronephrosis. Stomach/Bowel: The stomach, duodenum, visualized small bowel and visualized colon are unremarkable. Vascular/Lymphatic: The aorta and branch vessels are patent. The major venous structures are patent. No mesenteric or retroperitoneal mass or adenopathy. Other:  No ascites or abdominal wall hernia. Musculoskeletal: No significant bony findings. IMPRESSION: 1. Gallbladder wall thickening and enhancement and pericholecystic fluid could suggest acalculus cholecystitis. 2.  Normal caliber and course of the common bile duct. No common bile duct stones. 3. Fatty infiltration of the liver but no focal hepatic lesions or intrahepatic biliary dilatation. 4. No abdominal mass or adenopathy. Electronically Signed   By: Marijo Sanes M.D.   On: 10/13/2021 21:47    Impression: Elevated LFTs, RUQ pain - ALT 220, AST 55, alk phos 223, T. bili 1.4 (LFTs normal 07/2021) - BUN 13, creatinine 0.74 - Normal CBC - Negative hepatitis panel -CT 10/13/2021 shows gallbladder wall thickening suspicious for cholecystitis. - RUQ US showed hepatic steatosis, thickened and edematous gallbladder wall without cholelithiasis, mild dilatation of the CBD. -MRCP showed gallbladder wall thickening and enhancement and pericholecystic fluid, possibly representing acalculus cholecystitis.  Normal caliber and course of CBD, no CBD stones.  Fatty infiltration of the liver, no mass or adenopathy. -HIDA scan showing normal gallbladder ejection fraction, patent cystic and common hepatic ducts.  Delayed radiotracer clearance from hepatic parenchyma suggestive of hepatocellular dysfunction.  Plan: Presentation most consistent with cholecystitis, but without clear confirmation on imaging studies. Surgery not recommending cholecystectomy at this time. We will rule out infectious causes of RUQ pain and elevated LFTs.  If normal and patient still  symptomatic/LFTs elevated, recommend reevaluation for cholecystitis. Continue full liquids as tolerated.  Continue supportive care.  [facility] will follow.    LOS: 0 days   Angelique Holm  PA-C 10/15/2021, 11:06 AM  Contact #  [phone]

## 2021-10-15 NOTE — Progress Notes (Signed)
PROGRESS NOTE    Richard Doyle  [id number] DOB: May 04, 1976 DOA: 10/13/2021 PCP: Curlene Labrum, MD  Chief Complaint  Patient presents with   Abdominal Pain    Brief Narrative:  46 yo with hx heart failure, OA, T2DM, hypertension, dyslipidemia, obesity presenting with RUQ abdominal discomfort and elevated LFT's.  See below for additional details    Assessment & Plan:   Principal Problem:   Right upper quadrant abdominal pain Active Problems:   Transaminitis   Type 2 diabetes mellitus (HCC)   Chronic combined systolic and diastolic congestive heart failure (HCC)   Hypokalemia   Essential hypertension   Hypocalcemia   Class 3 obesity (HCC)   RUQ pain   Assessment and Plan: * Right upper quadrant abdominal pain CT with findings concerning for cholecystitis RUQ [place] without findings to suggest cholecystitis MRCP shows possible acalculous cholecystitis HIDA with evidence of hepatocellular dysfunction, patent cystic and common hepatic ducts Appreciate GI and surgery assistance Will ask surgery to reevaluate  Transaminitis- (present on admission) Negative acute hepatitis panel MRCP with normal caliber and course of CBD, does have fatty infiltration of liver LFT's improved Follow EBV, CMV   Type 2 diabetes mellitus ([place]) SSI Will add 12 units basal (he's on lantus 25 units daily at home and 15-20 humalog TID as well as jardiance, metformin, ozempic)  Chronic combined systolic and diastolic congestive heart failure ([place])- (present on admission) Echo 11/2019 with EF >55% Continue metop, spironolactone, and entresto Lasix currently on hold  Essential hypertension Entresto, metop, spiro Lasix on hold  Hypokalemia Replace and follow   DVT prophylaxis: lovenox Code Status: full Family Communication: wife Disposition:   Status is: Observation The patient will require care spanning > 2 midnights and should be moved to inpatient because: need for IV abx,  continued GI and surgery eval   Consultants:  Surgery GI  Procedures:  none  Antimicrobials:  Anti-infectives (From admission, onward)    Start     Dose/Rate Route Frequency Ordered Stop   10/13/21 1200  piperacillin-tazobactam (ZOSYN) IVPB 3.375 g        3.375 g 12.5 mL/hr over 240 Minutes Intravenous Every 8 hours 10/13/21 1129         Subjective: No new complaints  Objective: Vitals:   10/14/21 2132 10/15/21 0556 10/15/21 0950 10/15/21 1327  BP: (!) 144/92 135/87 135/81 130/77  Pulse: 72 72 78 76  Resp: 16 16 18 16   Temp: 98.3 F (36.8 C) 97.7 F (36.5 C) 97.7 F (36.5 C) 98 F (36.7 C)  TempSrc: Oral Oral Oral   SpO2: 100% 98% 97% 96%  Weight:      Height:        Intake/Output Summary (Last 24 hours) at 10/15/2021 1748 Last data filed at 10/15/2021 1700 Gross per 24 hour  Intake 480 ml  Output 1500 ml  Net -1020 ml   Filed Weights   10/13/21 0001  Weight: (!) 141.5 kg    Examination:  General: No acute distress. Cardiovascular: RRR Lungs: unlabored Abdomen: Soft, nontender, nondistended Neurological: Alert and oriented 3. Moves all extremities 4 . Cranial nerves II through XII grossly intact. Skin: Warm and dry. No rashes or lesions. Extremities: No clubbing or cyanosis. No edema.   Data Reviewed: I have personally reviewed following labs and imaging studies  CBC: Recent Labs  Lab 10/13/21 0010 10/13/21 0856 10/14/21 0428 10/15/21 0423  WBC 11.0* 6.7 7.0 6.3  NEUTROABS 7.7  --   --  4.3  HGB  16.2 15.4 16.4 15.8  HCT 47.4 45.7 48.2 47.2  MCV 85.6 86.9 86.8 87.4  PLT 319 289 250 [id number]    Basic Metabolic Panel: Recent Labs  Lab 10/13/21 0010 10/13/21 0856 10/14/21 0428 10/15/21 0423  NA 138 134* 131* 133*  K 3.7 3.8 3.1* 3.2*  CL 100 100 100 102  CO2 26 25 23 23   GLUCOSE 249* 265* 237* 166*  BUN 16 17 11 13   CREATININE 0.78 0.64 0.72 0.74  CALCIUM 9.3 8.3* 8.0* 7.9*  MG  --  1.8  --  2.0  PHOS  --   --   --  2.7     GFR: Estimated Creatinine Clearance: 171 mL/min (by C-G formula based on SCr of 0.74 mg/dL).  Liver Function Tests: Recent Labs  Lab 10/13/21 0010 10/13/21 0856 10/14/21 0428 10/15/21 0423  AST 137* 491* 172* 55*  ALT 98* 497* 372* 220*  ALKPHOS 144* 219* 266* 223*  BILITOT 1.3* 2.3* 2.5* 1.4*  PROT 6.2* 7.1 6.5 6.0*  ALBUMIN 4.0 3.8 3.7 3.2*    CBG: Recent Labs  Lab 10/14/21 1813 10/14/21 2351 10/15/21 0553 10/15/21 1157 10/15/21 1739  GLUCAP 247* 178* 154* 241* 161*     Recent Results (from the past 240 hour(s))  Resp Panel by RT-PCR (Flu Joquan Lotz&B, Covid) Nasopharyngeal Swab     Status: None   Collection Time: 10/13/21  6:23 AM   Specimen: Nasopharyngeal Swab; Nasopharyngeal(NP) swabs in vial transport medium  Result Value Ref Range Status   SARS Coronavirus 2 by RT PCR NEGATIVE NEGATIVE Final    Comment: (NOTE) SARS-CoV-2 target nucleic acids are NOT DETECTED.  The SARS-CoV-2 RNA is generally detectable in upper respiratory specimens during the acute phase of infection. The lowest concentration of SARS-CoV-2 viral copies this assay can detect is 138 copies/mL. Jadore Veals negative result does not preclude SARS-Cov-2 infection and should not be used as the sole basis for treatment or other patient management decisions. Tyisha Cressy negative result may occur with  improper specimen collection/handling, submission of specimen other than nasopharyngeal swab, presence of viral mutation(s) within the areas targeted by this assay, and inadequate number of viral copies(<138 copies/mL). Grayson White negative result must be combined with clinical observations, patient history, and epidemiological information. The expected result is Negative.  Fact Sheet for Patients:  EntrepreneurPulse.com.au  Fact Sheet for Healthcare Providers:  IncredibleEmployment.be  This test is no t yet approved or cleared by the [place] FDA and  has been authorized for detection  and/or diagnosis of SARS-CoV-2 by FDA under an Emergency Use Authorization (EUA). This EUA will remain  in effect (meaning this test can be used) for the duration of the COVID-19 declaration under Section 564(b)(1) of the Act, 21 U.S.C.section 360bbb-3(b)(1), unless the authorization is terminated  or revoked sooner.       Influenza Neelie Welshans by PCR NEGATIVE NEGATIVE Final   Influenza B by PCR NEGATIVE NEGATIVE Final    Comment: (NOTE) The Xpert Xpress SARS-CoV-2/FLU/RSV plus assay is intended as an aid in the diagnosis of influenza from Nasopharyngeal swab specimens and should not be used as Dayten Juba sole basis for treatment. Nasal washings and aspirates are unacceptable for Xpert Xpress SARS-CoV-2/FLU/RSV testing.  Fact Sheet for Patients: EntrepreneurPulse.com.au  Fact Sheet for Healthcare Providers: IncredibleEmployment.be  This test is not yet approved or cleared by the [place] FDA and has been authorized for detection and/or diagnosis of SARS-CoV-2 by FDA under an Emergency Use Authorization (EUA). This EUA will remain in effect (meaning this  test can be used) for the duration of the COVID-19 declaration under Section 564(b)(1) of the Act, 21 U.S.C. section 360bbb-3(b)(1), unless the authorization is terminated or revoked.  Performed at [facility], [address], [place], [place] [address]          Radiology Studies: MR 3D Recon At Scanner  Result Date: 10/13/2021 CLINICAL DATA:  Jaundice. EXAM: MRI ABDOMEN WITHOUT AND WITH CONTRAST (INCLUDING MRCP) TECHNIQUE: Multiplanar multisequence MR imaging of the abdomen was performed both before and after the administration of intravenous contrast. Heavily T2-weighted images of the biliary and pancreatic ducts were obtained, and three-dimensional MRCP images were rendered by post processing. CONTRAST:  58mL GADAVIST GADOBUTROL 1 MMOL/ML IV SOLN COMPARISON:  CT and ultrasound  examinations, same date. FINDINGS: Examination is limited due to breathing motion artifact. Lower chest: The lung bases are grossly clear. No pleural or pericardial effusion. Hepatobiliary: Heterogeneous areas of loss of signal intensity on the out of phase gradient echo T1 weighted sequence suggesting fatty infiltration. No worrisome hepatic lesions. No intrahepatic biliary dilatation. Normal caliber and course of the common bile duct. No common bile duct stones are identified. There is gallbladder wall thickening, gallbladder wall enhancement and pericholecystic fluid without definite gallstones. Findings could suggest Bradlee Heitman calculus cholecystitis. Pancreas:  No mass, inflammation or ductal dilatation. Spleen:  Normal size.  No focal lesions. Adrenals/Urinary Tract: The adrenal glands and kidneys are unremarkable. Small renal cysts are noted. No worrisome renal lesions or hydronephrosis. Stomach/Bowel: The stomach, duodenum, visualized small bowel and visualized colon are unremarkable. Vascular/Lymphatic: The aorta and branch vessels are patent. The major venous structures are patent. No mesenteric or retroperitoneal mass or adenopathy. Other:  No ascites or abdominal wall hernia. Musculoskeletal: No significant bony findings. IMPRESSION: 1. Gallbladder wall thickening and enhancement and pericholecystic fluid could suggest acalculus cholecystitis. 2. Normal caliber and course of the common bile duct. No common bile duct stones. 3. Fatty infiltration of the liver but no focal hepatic lesions or intrahepatic biliary dilatation. 4. No abdominal mass or adenopathy. Electronically Signed   By: Marijo Sanes M.D.   On: 10/13/2021 21:47   NM Hepato W/EF  Result Date: 10/14/2021 CLINICAL DATA:  Three days of right upper quadrant abdominal pain EXAM: NUCLEAR MEDICINE HEPATOBILIARY IMAGING WITH GALLBLADDER EF TECHNIQUE: Sequential images of the abdomen were obtained out to 60 minutes following intravenous administration of  radiopharmaceutical. After oral ingestion of Ensure, gallbladder ejection fraction was determined. At 60 min, normal ejection fraction is greater than 33%. RADIOPHARMACEUTICALS:  7.5 mCi Tc-79m  Choletec IV COMPARISON:  MRI October 13, 2021 and ultrasound October 13, 2021. FINDINGS: There is prompt, uniform radiotracer uptake by the liver but with delayed clearance of radiotracer from the hepatic parenchyma. Normal filling of the intrahepatic ducts, common bile duct. Gallbladder activity is visualized, consistent with patency of cystic duct (normal < 60 minutes). Additionally there is normal biliary to bowel transit (normal < 60 minutes), consistent with patent common bile duct. Ensure was administered and the gallbladder appears to empty normally on sequential images. Calculated gallbladder ejection fraction is 100%. (Normal gallbladder ejection fraction with Ensure is greater than 33%.) No evidence of enterogastric biliary reflux. IMPRESSION: 1. Delayed radiotracer clearance from the hepatic parenchyma suggestive of hepatocellular dysfunction. 2.  Normal gallbladder ejection fraction 3.  Patent cystic and common hepatic ducts. Electronically Signed   By: Dahlia Bailiff M.D.   On: 10/14/2021 17:57   MR ABDOMEN MRCP W WO CONTAST  Result Date: 10/13/2021 CLINICAL DATA:  Jaundice. EXAM: MRI ABDOMEN WITHOUT AND WITH CONTRAST (INCLUDING MRCP) TECHNIQUE: Multiplanar multisequence MR imaging of the abdomen was performed both before and after the administration of intravenous contrast. Heavily T2-weighted images of the biliary and pancreatic ducts were obtained, and three-dimensional MRCP images were rendered by post processing. CONTRAST:  55mL GADAVIST GADOBUTROL 1 MMOL/ML IV SOLN COMPARISON:  CT and ultrasound examinations, same date. FINDINGS: Examination is limited due to breathing motion artifact. Lower chest: The lung bases are grossly clear. No pleural or pericardial effusion. Hepatobiliary: Heterogeneous  areas of loss of signal intensity on the out of phase gradient echo T1 weighted sequence suggesting fatty infiltration. No worrisome hepatic lesions. No intrahepatic biliary dilatation. Normal caliber and course of the common bile duct. No common bile duct stones are identified. There is gallbladder wall thickening, gallbladder wall enhancement and pericholecystic fluid without definite gallstones. Findings could suggest [place]ryx Ricke calculus cholecystitis. Pancreas:  No mass, inflammation or ductal dilatation. Spleen:  Normal size.  No focal lesions. Adrenals/Urinary Tract: The adrenal glands and kidneys are unremarkable. Small renal cysts are noted. No worrisome renal lesions or hydronephrosis. Stomach/Bowel: The stomach, duodenum, visualized small bowel and visualized colon are unremarkable. Vascular/Lymphatic: The aorta and branch vessels are patent. The major venous structures are patent. No mesenteric or retroperitoneal mass or adenopathy. Other:  No ascites or abdominal wall hernia. Musculoskeletal: No significant bony findings. IMPRESSION: 1. Gallbladder wall thickening and enhancement and pericholecystic fluid could suggest acalculus cholecystitis. 2. Normal caliber and course of the common bile duct. No common bile duct stones. 3. Fatty infiltration of the liver but no focal hepatic lesions or intrahepatic biliary dilatation. 4. No abdominal mass or adenopathy. Electronically Signed   By: Marijo Sanes M.D.   On: 10/13/2021 21:47        Scheduled Meds:  enoxaparin (LOVENOX) injection  70 mg Subcutaneous Q24H   insulin aspart  0-9 Units Subcutaneous Q6H   insulin glargine-yfgn  12 Units Subcutaneous QHS   metoprolol succinate  50 mg Oral BID   pantoprazole (PROTONIX) IV  40 mg Intravenous Q24H   sacubitril-valsartan  1 tablet Oral BID   sodium chloride flush  3 mL Intravenous Once   spironolactone  12.5 mg Oral Daily   Continuous Infusions:  piperacillin-tazobactam 3.375 g (10/15/21 1106)     LOS:  0 days    Time spent: over 30 min    Fayrene Helper, MD [facility]   To contact the attending provider between 7A-7P or the covering provider during after hours 7P-7A, please log into the web site [email or web address] and access using universal [place] password for that web site. If you do not have the password, please call the hospital operator.  10/15/2021, 5:48 PM

## 2021-10-16 DIAGNOSIS — R1011 Right upper quadrant pain: Secondary | ICD-10-CM | POA: Diagnosis not present

## 2021-10-16 LAB — CBC WITH DIFFERENTIAL/PLATELET
Abs Immature Granulocytes: 0.02 10*3/uL (ref 0.00–0.07)
Basophils Absolute: 0 10*3/uL (ref 0.0–0.1)
Basophils Relative: 0 %
Eosinophils Absolute: 0.3 10*3/uL (ref 0.0–0.5)
Eosinophils Relative: 6 %
HCT: 44.2 % (ref 39.0–52.0)
Hemoglobin: 15 g/dL (ref 13.0–17.0)
Immature Granulocytes: 0 %
Lymphocytes Relative: 22 %
Lymphs Abs: 1.1 10*3/uL (ref 0.7–4.0)
MCH: 29.3 pg (ref 26.0–34.0)
MCHC: 33.9 g/dL (ref 30.0–36.0)
MCV: 86.3 fL (ref 80.0–100.0)
Monocytes Absolute: 0.5 10*3/uL (ref 0.1–1.0)
Monocytes Relative: 10 %
Neutro Abs: 2.9 10*3/uL (ref 1.7–7.7)
Neutrophils Relative %: 62 %
Platelets: 265 10*3/uL (ref 150–400)
RBC: 5.12 MIL/uL (ref 4.22–5.81)
RDW: 13.8 % (ref 11.5–15.5)
WBC: 4.7 10*3/uL (ref 4.0–10.5)
nRBC: 0 % (ref 0.0–0.2)

## 2021-10-16 LAB — COMPREHENSIVE METABOLIC PANEL
ALT: 153 U/L — ABNORMAL HIGH (ref 0–44)
AST: 29 U/L (ref 15–41)
Albumin: 3.3 g/dL — ABNORMAL LOW (ref 3.5–5.0)
Alkaline Phosphatase: 195 U/L — ABNORMAL HIGH (ref 38–126)
Anion gap: 8 (ref 5–15)
BUN: 8 mg/dL (ref 6–20)
CO2: 23 mmol/L (ref 22–32)
Calcium: 8.4 mg/dL — ABNORMAL LOW (ref 8.9–10.3)
Chloride: 102 mmol/L (ref 98–111)
Creatinine, Ser: 0.64 mg/dL (ref 0.61–1.24)
GFR, Estimated: >60 mL/min (ref >60)
Glucose, Bld: 171 mg/dL — ABNORMAL HIGH (ref 70–99)
Potassium: 3.3 mmol/L — ABNORMAL LOW (ref 3.5–5.1)
Sodium: 133 mmol/L — ABNORMAL LOW (ref 135–145)
Total Bilirubin: 1 mg/dL (ref 0.3–1.2)
Total Protein: 6.3 g/dL — ABNORMAL LOW (ref 6.5–8.1)

## 2021-10-16 LAB — EPSTEIN-BARR VIRUS VCA, IGM: EBV VCA IgM: <36 U/mL (ref 0.0–35.9)

## 2021-10-16 LAB — GLUCOSE, CAPILLARY
Glucose-Capillary: 189 mg/dL — ABNORMAL HIGH (ref 70–99)
Glucose-Capillary: 250 mg/dL — ABNORMAL HIGH (ref 70–99)
Glucose-Capillary: 320 mg/dL — ABNORMAL HIGH (ref 70–99)

## 2021-10-16 LAB — CMV IGM: CMV IgM: <30 AU/mL (ref 0.0–29.9)

## 2021-10-16 LAB — MAGNESIUM: Magnesium: 1.9 mg/dL (ref 1.7–2.4)

## 2021-10-16 LAB — PHOSPHORUS: Phosphorus: 3.2 mg/dL (ref 2.5–4.6)

## 2021-10-16 MED ORDER — OXYCODONE HCL 5 MG PO TABS: 5.0000 mg | ORAL_TABLET | Freq: Four times a day (QID) | ORAL | 0 refills | Status: AC | PRN

## 2021-10-16 MED ORDER — AMOXICILLIN-POT CLAVULANATE 875-125 MG PO TABS: 1.0000 | ORAL_TABLET | Freq: Two times a day (BID) | ORAL | 0 refills | Status: AC

## 2021-10-16 NOTE — Discharge Summary (Signed)
Physician Discharge Summary  Richard Doyle [id number] DOB: 06/30/1976 DOA: 10/13/2021  PCP: Curlene Labrum, MD  Admit date: 10/13/2021 Discharge date: 10/16/2021  Time spent: 40 minutes  Recommendations for Outpatient Follow-up:  Follow outpatient CBC/CMP Follow EBV/CMV outpatient  Follow with general surgery for evaluation for outpatient cholecystectomy Follow with GI within 3 months, needs screening colonoscopy   Discharge Diagnoses:  Principal Problem:   Right upper quadrant abdominal pain Active Problems:   Transaminitis   Type 2 diabetes mellitus ([place])   Chronic combined systolic and diastolic congestive heart failure (HCC)   Hypokalemia   Essential hypertension   Hypocalcemia   Class 3 obesity ([place])   RUQ pain   Discharge Condition: stable  Diet recommendation: diabetic, heart healthy  Filed Weights   10/13/21 0001  Weight: (!) 141.5 kg    History of present illness:  46 yo with hx heart failure, OA, T2DM, hypertension, dyslipidemia, obesity presenting with RUQ abdominal discomfort and elevated LFT's.  MRCP was concerning for possible acalculous cholecystitis, but had normal HIDA scan.  Still concern for acalculous cholecystitsi vs chronic cholecystitis.  Plan is for outpatient follow up with surgery on abx to consider cholecystectomy outpatient.    See below for additional details  Hospital Course:  Assessment and Plan: * Right upper quadrant abdominal pain CT with findings concerning for cholecystitis RUQ [place] without findings to suggest cholecystitis MRCP shows possible acalculous cholecystitis HIDA with evidence of hepatocellular dysfunction, patent cystic and common hepatic ducts Appreciate GI and surgery assistance Concern for acalculous cholecystitis vs chronic cholecystitis Surgery planning to follow outpatient to consider elective cholecystectomy - will discharge with abx Follow up with gastroenterology in 3 months  Transaminitis- (present on  admission) Negative acute hepatitis panel MRCP with normal caliber and course of CBD, does have fatty infiltration of liver LFT's improved Follow EBV, CMV   Type 2 diabetes mellitus ([place]) Resume home regimen  Chronic combined systolic and diastolic congestive heart failure ([place])- (present on admission) Echo 11/2019 with EF >55%, NICM, had non obstructive CAD on LHC 03/2019 Continue metop, spironolactone, and entresto and lasix  Essential hypertension Entresto, metop, spiro, resume lasix  Hypokalemia Replace and follow   Procedures: none   Consultations: Gi surgery  Discharge Exam: Vitals:   10/16/21 0912 10/16/21 1142  BP: (!) 142/97 (!) 143/96  Pulse: 67 68  Resp:  16  Temp:  (!) 97.4 F (36.3 C)  SpO2:  93%   Eager to discharge if possible today Pain improved Wife at bedside  General: No acute distress. Cardiovascular: RRR Lungs: unlabored Abdomen: very mild RUQ ttp  Neurological: Alert and oriented 3. Moves all extremities 4. Cranial nerves II through XII grossly intact. Skin: Warm and dry. No rashes or lesions. Extremities: No clubbing or cyanosis. No edema  Discharge Instructions   Discharge Instructions     Call MD for:  difficulty breathing, headache or visual disturbances   Complete by: As directed    Call MD for:  extreme fatigue   Complete by: As directed    Call MD for:  hives   Complete by: As directed    Call MD for:  persistant dizziness or light-headedness   Complete by: As directed    Call MD for:  persistant nausea and vomiting   Complete by: As directed    Call MD for:  redness, tenderness, or signs of infection (pain, swelling, redness, odor or green/yellow discharge around incision site)   Complete by: As directed    Call  MD for:  severe uncontrolled pain   Complete by: As directed    Call MD for:  temperature >100.4   Complete by: As directed    Diet - low sodium heart healthy   Complete by: As directed    Discharge  instructions   Complete by: As directed    You were seen for abdominal pain and we think your symptoms are likely related to chronic cholecystitis or acalculous cholecystitis, though the imaging was not clearly indicative.    You'll follow with general surgery as an outpatient to discuss cholecystectomy (gallbladder removal).  Since you're going to follow outpatient, I would recommend you discuss with your cardiologist any plans for surgery.    I'll send you with Richard Doyle few doses of oxycodone for pain as needed.  Use this judiciously, if your pain isn't controlled, you should return to care for additional evaluation.  We'll send you home with another 7 days of antibiotics.  Follow with gastroenterology outpatient.  You need Richard Doyle screening colonoscopy.   Return for new, recurrent, or worsening symptoms.  Please ask your PCP to request records from this hospitalization so they know what was done and what the next steps will be.   Increase activity slowly   Complete by: As directed       Allergies as of 10/16/2021   No Known Allergies      Medication List     STOP taking these medications    GOODY HEADACHE PO       TAKE these medications    amoxicillin-clavulanate 875-125 MG tablet Commonly known as: Augmentin Take 1 tablet by mouth 2 (two) times daily for 7 days.   Entresto 97-103 MG Generic drug: sacubitril-valsartan Take 1 tablet by mouth 2 (two) times daily.   fluticasone 50 MCG/ACT nasal spray Commonly known as: FLONASE Place 1 spray into both nostrils daily.   furosemide 20 MG tablet Commonly known as: LASIX Take 40 mg by mouth daily as needed for fluid.   HumaLOG KwikPen 200 UNIT/ML KwikPen Generic drug: insulin lispro Inject 15-20 Units into the skin 3 (three) times daily after meals.   Jardiance 25 MG Tabs tablet Generic drug: empagliflozin Take 25 mg by mouth daily.   Lantus SoloStar 100 UNIT/ML Solostar Pen Generic drug: insulin glargine Inject 25 Units  into the skin in the morning.   meclizine 25 MG tablet Commonly known as: ANTIVERT Take 25-50 mg by mouth 3 (three) times daily as needed for dizziness.   metFORMIN 500 MG 24 hr tablet Commonly known as: GLUCOPHAGE-XR Take 1,000 mg by mouth daily with breakfast.   metoprolol succinate 50 MG 24 hr tablet Commonly known as: TOPROL-XL Take 50 mg by mouth 2 (two) times daily.   neomycin-polymyxin-hydrocortisone 3.5-10000-1 OTIC suspension Commonly known as: CORTISPORIN Place 3 drops into the right ear 2 (two) times daily.   ofloxacin 0.3 % OTIC solution Commonly known as: FLOXIN Place 5 drops into the right ear 2 (two) times daily.   oxyCODONE 5 MG immediate release tablet Commonly known as: Roxicodone Take 1 tablet (5 mg total) by mouth every 6 (six) hours as needed for up to 3 days.   Ozempic (1 MG/DOSE) 4 MG/3ML Sopn Generic drug: Semaglutide (1 MG/DOSE) Inject 1 mg into the skin once Richard Doyle week. Sunday   spironolactone 25 MG tablet Commonly known as: ALDACTONE Take 12.5 mg by mouth daily.       No Known Allergies  Follow-up Information     Kathi Der, MD. Schedule  an appointment as soon as possible for Richard Doyle visit in 3 month(s).   Specialty: Gastroenterology Why: Abnormal LFTs, screening for colon cancer Contact information: [place] [address] [phone]         Surgery, [place] Follow up.   Specialty: General Surgery Why: Call for follow up regarding your elective cholecystectomy (gallbladder surgery) Contact information: [place] [address] [phone]         Curlene Labrum, MD Follow up.   Specialty: Family Medicine Contact information: [place]  [address] [phone]                  The results of significant diagnostics from this hospitalization (including imaging, microbiology, ancillary and laboratory) are listed below for reference.    Significant  Diagnostic Studies: CT ABDOMEN PELVIS W CONTRAST  Result Date: 10/13/2021 CLINICAL DATA:  Acute abdominal pain EXAM: CT ABDOMEN AND PELVIS WITH CONTRAST TECHNIQUE: Multidetector CT imaging of the abdomen and pelvis was performed using the standard protocol following bolus administration of intravenous contrast. RADIATION DOSE REDUCTION: This exam was performed according to the departmental dose-optimization program which includes automated exposure control, adjustment of the mA and/or kV according to patient size and/or use of iterative reconstruction technique. CONTRAST:  144mL OMNIPAQUE IOHEXOL 300 MG/ML  SOLN COMPARISON:  None. FINDINGS: Lower chest: No acute abnormality. Hepatobiliary: Fatty infiltration of the liver is noted. Gallbladder is well distended. Gallbladder wall thickening is noted suspicious for cholecystitis. Pancreas: Unremarkable. No pancreatic ductal dilatation or surrounding inflammatory changes. Spleen: Normal in size without focal abnormality. Adrenals/Urinary Tract: Adrenal glands are within normal limits. Kidneys demonstrate Makaylen Thieme normal enhancement pattern bilaterally. Small renal cysts are noted bilaterally. Nonobstructing stones are noted in the right kidney the largest of which measures 3 mm in the lower pole. Tiny nonobstructing left renal stone is noted as well. The ureters are within normal limits. Bladder is well distended. Stomach/Bowel: Appendix is well visualized and within normal limits. No obstructive or inflammatory changes of the colon are noted. Stomach is within normal limits. Small bowel is unremarkable. Vascular/Lymphatic: Aortic atherosclerosis. No enlarged abdominal or pelvic lymph nodes. Reproductive: Prostate is unremarkable. Other: No abdominal wall hernia or abnormality. No abdominopelvic ascites. Musculoskeletal: Degenerative changes of the lumbar spine are noted. IMPRESSION: Gallbladder wall thickening suspicious for cholecystitis. Ultrasound is recommended for  further evaluation. Nonobstructing renal calculi bilaterally. Appendix is well visualized and within normal limits. Electronically Signed   By: Inez Catalina M.D.   On: 10/13/2021 01:37   MR 3D Recon At Scanner  Result Date: 10/13/2021 CLINICAL DATA:  Jaundice. EXAM: MRI ABDOMEN WITHOUT AND WITH CONTRAST (INCLUDING MRCP) TECHNIQUE: Multiplanar multisequence MR imaging of the abdomen was performed both before and after the administration of intravenous contrast. Heavily T2-weighted images of the biliary and pancreatic ducts were obtained, and three-dimensional MRCP images were rendered by post processing. CONTRAST:  55mL GADAVIST GADOBUTROL 1 MMOL/ML IV SOLN COMPARISON:  CT and ultrasound examinations, same date. FINDINGS: Examination is limited due to breathing motion artifact. Lower chest: The lung bases are grossly clear. No pleural or pericardial effusion. Hepatobiliary: Heterogeneous areas of loss of signal intensity on the out of phase gradient echo T1 weighted sequence suggesting fatty infiltration. No worrisome hepatic lesions. No intrahepatic biliary dilatation. Normal caliber and course of the common bile duct. No common bile duct stones are identified. There is gallbladder wall thickening, gallbladder wall enhancement and pericholecystic fluid without definite  gallstones. Findings could suggest Zalma Channing calculus cholecystitis. Pancreas:  No mass, inflammation or ductal dilatation. Spleen:  Normal size.  No focal lesions. Adrenals/Urinary Tract: The adrenal glands and kidneys are unremarkable. Small renal cysts are noted. No worrisome renal lesions or hydronephrosis. Stomach/Bowel: The stomach, duodenum, visualized small bowel and visualized colon are unremarkable. Vascular/Lymphatic: The aorta and branch vessels are patent. The major venous structures are patent. No mesenteric or retroperitoneal mass or adenopathy. Other:  No ascites or abdominal wall hernia. Musculoskeletal: No significant bony findings.  IMPRESSION: 1. Gallbladder wall thickening and enhancement and pericholecystic fluid could suggest acalculus cholecystitis. 2. Normal caliber and course of the common bile duct. No common bile duct stones. 3. Fatty infiltration of the liver but no focal hepatic lesions or intrahepatic biliary dilatation. 4. No abdominal mass or adenopathy. Electronically Signed   By: Marijo Sanes M.D.   On: 10/13/2021 21:47   NM Hepato W/EF  Result Date: 10/14/2021 CLINICAL DATA:  Three days of right upper quadrant abdominal pain EXAM: NUCLEAR MEDICINE HEPATOBILIARY IMAGING WITH GALLBLADDER EF TECHNIQUE: Sequential images of the abdomen were obtained out to 60 minutes following intravenous administration of radiopharmaceutical. After oral ingestion of Ensure, gallbladder ejection fraction was determined. At 60 min, normal ejection fraction is greater than 33%. RADIOPHARMACEUTICALS:  7.5 mCi Tc-56m  Choletec IV COMPARISON:  MRI October 13, 2021 and ultrasound October 13, 2021. FINDINGS: There is prompt, uniform radiotracer uptake by the liver but with delayed clearance of radiotracer from the hepatic parenchyma. Normal filling of the intrahepatic ducts, common bile duct. Gallbladder activity is visualized, consistent with patency of cystic duct (normal < 60 minutes). Additionally there is normal biliary to bowel transit (normal < 60 minutes), consistent with patent common bile duct. Ensure was administered and the gallbladder appears to empty normally on sequential images. Calculated gallbladder ejection fraction is 100%. (Normal gallbladder ejection fraction with Ensure is greater than 33%.) No evidence of enterogastric biliary reflux. IMPRESSION: 1. Delayed radiotracer clearance from the hepatic parenchyma suggestive of hepatocellular dysfunction. 2.  Normal gallbladder ejection fraction 3.  Patent cystic and common hepatic ducts. Electronically Signed   By: Dahlia Bailiff M.D.   On: 10/14/2021 17:57   MR ABDOMEN MRCP W WO  CONTAST  Result Date: 10/13/2021 CLINICAL DATA:  Jaundice. EXAM: MRI ABDOMEN WITHOUT AND WITH CONTRAST (INCLUDING MRCP) TECHNIQUE: Multiplanar multisequence MR imaging of the abdomen was performed both before and after the administration of intravenous contrast. Heavily T2-weighted images of the biliary and pancreatic ducts were obtained, and three-dimensional MRCP images were rendered by post processing. CONTRAST:  48mL GADAVIST GADOBUTROL 1 MMOL/ML IV SOLN COMPARISON:  CT and ultrasound examinations, same date. FINDINGS: Examination is limited due to breathing motion artifact. Lower chest: The lung bases are grossly clear. No pleural or pericardial effusion. Hepatobiliary: Heterogeneous areas of loss of signal intensity on the out of phase gradient echo T1 weighted sequence suggesting fatty infiltration. No worrisome hepatic lesions. No intrahepatic biliary dilatation. Normal caliber and course of the common bile duct. No common bile duct stones are identified. There is gallbladder wall thickening, gallbladder wall enhancement and pericholecystic fluid without definite gallstones. Findings could suggest Kennia Vanvorst calculus cholecystitis. Pancreas:  No mass, inflammation or ductal dilatation. Spleen:  Normal size.  No focal lesions. Adrenals/Urinary Tract: The adrenal glands and kidneys are unremarkable. Small renal cysts are noted. No worrisome renal lesions or hydronephrosis. Stomach/Bowel: The stomach, duodenum, visualized small bowel and visualized colon are unremarkable. Vascular/Lymphatic: The aorta and branch vessels are patent.  The major venous structures are patent. No mesenteric or retroperitoneal mass or adenopathy. Other:  No ascites or abdominal wall hernia. Musculoskeletal: No significant bony findings. IMPRESSION: 1. Gallbladder wall thickening and enhancement and pericholecystic fluid could suggest acalculus cholecystitis. 2. Normal caliber and course of the common bile duct. No common bile duct stones. 3.  Fatty infiltration of the liver but no focal hepatic lesions or intrahepatic biliary dilatation. 4. No abdominal mass or adenopathy. Electronically Signed   By: Marijo Sanes M.D.   On: 10/13/2021 21:47   US Abdomen Limited RUQ (LIVER/GB)  Result Date: 10/13/2021 CLINICAL DATA:  46 year old male presenting with abnormal appearance of the gallbladder on recent CT examination. History of acute abdominal pain. EXAM: ULTRASOUND ABDOMEN LIMITED RIGHT UPPER QUADRANT COMPARISON:  No prior abdominal ultrasound. CT the abdomen and pelvis 10/13/2021. FINDINGS: Gallbladder: No definite calculi are identified within the gallbladder. Gallbladder is only mildly distended. Gallbladder wall appears heterogeneously thickened and edematous, most evident adjacent to the liver where the edematous wall measures up to 14 mm in diameter. The fundal portion of the gallbladder wall is normal in thickness. No significant volume of pericholecystic fluid. Per report from the sonographer, the patient was administered pain medication, and accurate assessment for sonographic Murphy's sign was not possible on today's examination. Common bile duct: Diameter: 9.1 mm Liver: No focal lesion identified. Heterogeneously increased hepatic echogenicity, indicative of Ronin Rehfeldt background of heterogeneous hepatic steatosis. Portal vein is patent on color Doppler imaging with normal direction of blood flow towards the liver. Other: None. IMPRESSION: 1. Thickened and edematous gallbladder wall. Given the absence of cholelithiasis, this finding is presumably reflective of intrinsic liver disease, particularly in the setting of hepatic steatosis. No definitive imaging findings to clearly indicate acute cholecystitis are otherwise noted at this time. 2. Hepatic steatosis. 3. Mild dilatation of the common bile duct. No definite intrahepatic biliary ductal dilatation. If there is clinical concern for choledocholithiasis or other cause of biliary tract obstruction,  further evaluation with abdominal MRI with and without IV gadolinium with MRCP could be considered. Electronically Signed   By: Vinnie Langton M.D.   On: 10/13/2021 06:08    Microbiology: Recent Results (from the past 240 hour(s))  Resp Panel by RT-PCR (Flu Alisa Stjames&B, Covid) Nasopharyngeal Swab     Status: None   Collection Time: 10/13/21  6:23 AM   Specimen: Nasopharyngeal Swab; Nasopharyngeal(NP) swabs in vial transport medium  Result Value Ref Range Status   SARS Coronavirus 2 by RT PCR NEGATIVE NEGATIVE Final    Comment: (NOTE) SARS-CoV-2 target nucleic acids are NOT DETECTED.  The SARS-CoV-2 RNA is generally detectable in upper respiratory specimens during the acute phase of infection. The lowest concentration of SARS-CoV-2 viral copies this assay can detect is 138 copies/mL. Malini Flemings negative result does not preclude SARS-Cov-2 infection and should not be used as the sole basis for treatment or other patient management decisions. Tru Rana negative result may occur with  improper specimen collection/handling, submission of specimen other than nasopharyngeal swab, presence of viral mutation(s) within the areas targeted by this assay, and inadequate number of viral copies(<138 copies/mL). Karthik Whittinghill negative result must be combined with clinical observations, patient history, and epidemiological information. The expected result is Negative.  Fact Sheet for Patients:  EntrepreneurPulse.com.au  Fact Sheet for Healthcare Providers:  IncredibleEmployment.be  This test is no t yet approved or cleared by the [place] FDA and  has been authorized for detection and/or diagnosis of SARS-CoV-2 by FDA under an Emergency Use Authorization (  EUA). This EUA will remain  in effect (meaning this test can be used) for the duration of the COVID-19 declaration under Section 564(b)(1) of the Act, 21 U.S.C.section 360bbb-3(b)(1), unless the authorization is terminated  or revoked  sooner.       Influenza Joani Cosma by PCR NEGATIVE NEGATIVE Final   Influenza B by PCR NEGATIVE NEGATIVE Final    Comment: (NOTE) The Xpert Xpress SARS-CoV-2/FLU/RSV plus assay is intended as an aid in the diagnosis of influenza from Nasopharyngeal swab specimens and should not be used as Linea Calles sole basis for treatment. Nasal washings and aspirates are unacceptable for Xpert Xpress SARS-CoV-2/FLU/RSV testing.  Fact Sheet for Patients: EntrepreneurPulse.com.au  Fact Sheet for Healthcare Providers: IncredibleEmployment.be  This test is not yet approved or cleared by the [place] FDA and has been authorized for detection and/or diagnosis of SARS-CoV-2 by FDA under an Emergency Use Authorization (EUA). This EUA will remain in effect (meaning this test can be used) for the duration of the COVID-19 declaration under Section 564(b)(1) of the Act, 21 U.S.C. section 360bbb-3(b)(1), unless the authorization is terminated or revoked.  Performed at [facility], [address] [place], [place] [address]      Labs: Basic Metabolic Panel: Recent Labs  Lab 10/13/21 0010 10/13/21 0856 10/14/21 0428 10/15/21 0423 10/16/21 0800  NA 138 134* 131* 133* 133*  K 3.7 3.8 3.1* 3.2* 3.3*  CL 100 100 100 102 102  CO2 26 25 23 23 23   GLUCOSE 249* 265* 237* 166* 171*  BUN 16 17 11 13 8   CREATININE 0.78 0.64 0.72 0.74 0.64  CALCIUM 9.3 8.3* 8.0* 7.9* 8.4*  MG  --  1.8  --  2.0 1.9  PHOS  --   --   --  2.7 3.2   Liver Function Tests: Recent Labs  Lab 10/13/21 0010 10/13/21 0856 10/14/21 0428 10/15/21 0423 10/16/21 0800  AST 137* 491* 172* 55* 29  ALT 98* 497* 372* 220* 153*  ALKPHOS 144* 219* 266* 223* 195*  BILITOT 1.3* 2.3* 2.5* 1.4* 1.0  PROT 6.2* 7.1 6.5 6.0* 6.3*  ALBUMIN 4.0 3.8 3.7 3.2* 3.3*   Recent Labs  Lab 10/13/21 0010  LIPASE 16   No results for input(s): AMMONIA in the last 168 hours. CBC: Recent Labs  Lab  10/13/21 0010 10/13/21 0856 10/14/21 0428 10/15/21 0423 10/16/21 0800  WBC 11.0* 6.7 7.0 6.3 4.7  NEUTROABS 7.7  --   --  4.3 2.9  HGB 16.2 15.4 16.4 15.8 15.0  HCT 47.4 45.7 48.2 47.2 44.2  MCV 85.6 86.9 86.8 87.4 86.3  PLT 319 289 250 244 265   Cardiac Enzymes: No results for input(s): CKTOTAL, CKMB, CKMBINDEX, TROPONINI in the last 168 hours. BNP: BNP (last 3 results) Recent Labs    08/02/21 0809  BNP 338.2*    ProBNP (last 3 results) No results for input(s): PROBNP in the last 8760 hours.  CBG: Recent Labs  Lab 10/15/21 1157 10/15/21 1739 10/16/21 0003 10/16/21 0518 10/16/21 1140  GLUCAP 241* 161* 320* 189* 250*       Signed:  Fayrene Helper MD.  [facility] 10/16/2021, 12:14 PM

## 2021-10-16 NOTE — Plan of Care (Signed)

## 2021-10-16 NOTE — Progress Notes (Signed)
[facility] Gastroenterology Progress Note  Richard Doyle 46 y.o. [birth year]  CC: Abdominal pain, abnormal LFTs   Subjective: Patient seen and examined at bedside.  Had mild right upper quadrant discomfort this morning which has resolved now.  Denies nausea or vomiting.  Denies diarrhea or constipation.  ROS : Afebrile, negative for chest pain   Objective: Vital signs in last 24 hours: Vitals:   10/16/21 0523 10/16/21 0912  BP: 90/69 (!) 142/97  Pulse: 69 67  Resp: 16   Temp: 97.6 F (36.4 C)   SpO2: 98%     Physical Exam:  General:  Alert, cooperative, no distress, appears stated age  Head:  Normocephalic, without obvious abnormality, atraumatic  Eyes:  , EOM's intact,   Lungs:   Clear to auscultation bilaterally, respirations unlabored  Heart:  Regular rate and rhythm, S1, S2 normal  Abdomen:   Soft, non-tender, bowel sounds active all four quadrants,  no masses,   Extremities: Extremities normal, atraumatic, no  edema  Pulses: 2+ and symmetric    Lab Results: Recent Labs    10/15/21 0423 10/16/21 0800  NA 133* 133*  K 3.2* 3.3*  CL 102 102  CO2 23 23  GLUCOSE 166* 171*  BUN 13 8  CREATININE 0.74 0.64  CALCIUM 7.9* 8.4*  MG 2.0 1.9  PHOS 2.7 3.2   Recent Labs    10/15/21 0423 10/16/21 0800  AST 55* 29  ALT 220* 153*  ALKPHOS 223* 195*  BILITOT 1.4* 1.0  PROT 6.0* 6.3*  ALBUMIN 3.2* 3.3*   Recent Labs    10/15/21 0423 10/16/21 0800  WBC 6.3 4.7  NEUTROABS 4.3 2.9  HGB 15.8 15.0  HCT 47.2 44.2  MCV 87.4 86.3  PLT 244 265   No results for input(s): LABPROT, INR in the last 72 hours.    Assessment/Plan: -Right upper quadrant and mild periumbilical abdominal pain.  MRI MRCP negative for choledocholithiasis but showed findings suspicious for cholecystitis mild gallbladder wall thickening.  Normal HIDA scan.  Patient did get worsening abdominal pain with Ensure intake during HIDA scan  -Abnormal LFTs.  Improving.  Normal T. bili.  Normal AST.  ALT  153 and alkaline phosphatase 195.  Normal CBC.  Hepatitis panel negative  Recommendation ------------------------- -Patient's right upper quadrant abdominal pain likely related to acalculous/chronic cholecystitis. -According to patient, he was seen by surgical team today and they are planning to have cholecystectomy done sometime next week or as an outpatient.  -Follow-up in GI clinic in 3 months.  Patient is also due for a screening colonoscopy  -No further inpatient GI work-up planned.  GI will sign off.  Call us back if   Otis Brace MD, [place] 10/16/2021, 10:48 AM  Contact #  [phone]

## 2021-10-16 NOTE — Progress Notes (Signed)
° °  Subjective/Chief Complaint: No complaints. Wants to eat and go home   Objective: Vital signs in last 24 hours: Temp:  [97.6 F (36.4 C)-98.7 F (37.1 C)] 97.6 F (36.4 C) (02/25 0523) Pulse Rate:  [67-76] 67 (02/25 0912) Resp:  [16] 16 (02/25 0523) BP: (90-142)/(69-97) 142/97 (02/25 0912) SpO2:  [96 %-99 %] 98 % (02/25 0523) Last BM Date : 10/13/21  Intake/Output from previous day: 02/24 0701 - 02/25 0700 In: 600 [P.O.:600] Out: 1400 [Urine:1400] Intake/Output this shift: No intake/output data recorded.  General appearance: alert and cooperative Resp: clear to auscultation bilaterally Cardio: regular rate and rhythm GI: soft, minimal RUQ pain  Lab Results:  Recent Labs    10/15/21 0423 10/16/21 0800  WBC 6.3 4.7  HGB 15.8 15.0  HCT 47.2 44.2  PLT 244 265   BMET Recent Labs    10/15/21 0423 10/16/21 0800  NA 133* 133*  K 3.2* 3.3*  CL 102 102  CO2 23 23  GLUCOSE 166* 171*  BUN 13 8  CREATININE 0.74 0.64  CALCIUM 7.9* 8.4*   PT/INR No results for input(s): LABPROT, INR in the last 72 hours. ABG No results for input(s): PHART, HCO3 in the last 72 hours.  Invalid input(s): PCO2, PO2  Studies/Results: NM Hepato W/EF  Result Date: 10/14/2021 CLINICAL DATA:  Three days of right upper quadrant abdominal pain EXAM: NUCLEAR MEDICINE HEPATOBILIARY IMAGING WITH GALLBLADDER EF TECHNIQUE: Sequential images of the abdomen were obtained out to 60 minutes following intravenous administration of radiopharmaceutical. After oral ingestion of Ensure, gallbladder ejection fraction was determined. At 60 min, normal ejection fraction is greater than 33%. RADIOPHARMACEUTICALS:  7.5 mCi Tc-53m  Choletec IV COMPARISON:  MRI October 13, 2021 and ultrasound October 13, 2021. FINDINGS: There is prompt, uniform radiotracer uptake by the liver but with delayed clearance of radiotracer from the hepatic parenchyma. Normal filling of the intrahepatic ducts, common bile duct.  Gallbladder activity is visualized, consistent with patency of cystic duct (normal < 60 minutes). Additionally there is normal biliary to bowel transit (normal < 60 minutes), consistent with patent common bile duct. Ensure was administered and the gallbladder appears to empty normally on sequential images. Calculated gallbladder ejection fraction is 100%. (Normal gallbladder ejection fraction with Ensure is greater than 33%.) No evidence of enterogastric biliary reflux. IMPRESSION: 1. Delayed radiotracer clearance from the hepatic parenchyma suggestive of hepatocellular dysfunction. 2.  Normal gallbladder ejection fraction 3.  Patent cystic and common hepatic ducts. Electronically Signed   By: Maudry Mayhew M.D.   On: 10/14/2021 17:57    Anti-infectives: Anti-infectives (From admission, onward)    Start     Dose/Rate Route Frequency Ordered Stop   10/13/21 1200  piperacillin-tazobactam (ZOSYN) IVPB 3.375 g        3.375 g 12.5 mL/hr over 240 Minutes Intravenous Every 8 hours 10/13/21 1129         Assessment/Plan: s/p * No surgery found * Advance diet Possible acalculous cholecystitis although no fever and normal wbc and normal HIDA. Options include possible cholecystectomy vs oral abx and follow up as outpt with [place] for consideration of elective cholecystectomy in near future. Pt wants to go home which I feel is reasonable. He can see one of [place] in clinic next week  LOS: 1 day    Chevis Pretty III 10/16/2021

## 2021-10-16 NOTE — Progress Notes (Signed)
Assessment unchanged. Pt and wife verbalized understanding of dc instructions through teach back. Discharged via wc to front entrance accompanied by NT.  °

## 2021-10-19 ENCOUNTER — Other Ambulatory Visit: Payer: Self-pay | Admitting: Surgery

## 2021-10-22 DIAGNOSIS — I5020 Unspecified systolic (congestive) heart failure: Secondary | ICD-10-CM | POA: Diagnosis not present

## 2021-10-22 DIAGNOSIS — R7401 Elevation of levels of liver transaminase levels: Secondary | ICD-10-CM | POA: Diagnosis not present

## 2021-10-22 DIAGNOSIS — E1169 Type 2 diabetes mellitus with other specified complication: Secondary | ICD-10-CM | POA: Diagnosis not present

## 2021-10-22 DIAGNOSIS — K819 Cholecystitis, unspecified: Secondary | ICD-10-CM | POA: Diagnosis not present

## 2021-10-26 DIAGNOSIS — I10 Essential (primary) hypertension: Secondary | ICD-10-CM | POA: Diagnosis not present

## 2021-10-26 DIAGNOSIS — I2510 Atherosclerotic heart disease of native coronary artery without angina pectoris: Secondary | ICD-10-CM | POA: Diagnosis not present

## 2021-10-26 DIAGNOSIS — E785 Hyperlipidemia, unspecified: Secondary | ICD-10-CM | POA: Diagnosis not present

## 2021-10-26 DIAGNOSIS — I5022 Chronic systolic (congestive) heart failure: Secondary | ICD-10-CM | POA: Diagnosis not present

## 2021-10-28 DIAGNOSIS — J984 Other disorders of lung: Secondary | ICD-10-CM | POA: Diagnosis not present

## 2021-10-28 DIAGNOSIS — I340 Nonrheumatic mitral (valve) insufficiency: Secondary | ICD-10-CM | POA: Diagnosis not present

## 2021-10-28 DIAGNOSIS — I5022 Chronic systolic (congestive) heart failure: Secondary | ICD-10-CM | POA: Diagnosis not present

## 2021-11-04 ENCOUNTER — Ambulatory Visit: Payer: Self-pay | Admitting: Surgery

## 2021-11-04 DIAGNOSIS — K819 Cholecystitis, unspecified: Secondary | ICD-10-CM | POA: Diagnosis not present

## 2021-11-04 DIAGNOSIS — K30 Functional dyspepsia: Secondary | ICD-10-CM | POA: Diagnosis not present

## 2021-11-04 NOTE — H&P (View-Only) (Signed)
? ? ? ?Richard Doyle ?[id number]  ? ?Referring Provider:  None ? ? ?Subjective  ? ?Chief Complaint: gallbladder  ?  ? ? ?History of Present Illness: ?   ?46 year old male following up after hospitalization last month.  He has a history of CHF/ NICM, last EF 55% in 2021, coronary artery disease, hypertension, hyperlipidemia, diabetes, obstructive sleep apnea and obesity and presented to the emergency room 2/23 with abdominal pain beginning about 12 hours prior to presentation.  This was epigastric and radiated to his chest, constant, rated as a 9 out of 10 and associated with constant nausea, emesis and diarrhea.  Pain aggravated by palpation and movement.  He denies any similar previous symptoms, no associated fever, chills, or shortness of breath.  He did initially have a low-grade temperature of 100.3 and mild tachycardia which improved with fluid resuscitation.  His lab work demonstrated a normal white count, normal lipase, and mildly elevated alk phos as well as AST and ALT in the upper 400s which did trend downward, and a bilirubin of 2.3 which remained essentially stable.  Hepatitis panel was negative.  He had an extensive imaging work-up including a CT abdomen pelvis and a right upper quadrant ultrasound which both demonstrated a thickened gallbladder wall but no gallstones.  MRCP also demonstrated gallbladder wall thickening but no gallstones or choledocholithiasis.  HIDA was then performed which was negative for acalculous cholecystitis.  ?Given negative HIDA, it was suggested that his liver dysfunction and edematous gallbladder wall may represent sequelae of his history of heart failure.  The gastroenterologist disagreed and did not think this represented a hepatocellular process or hepatic passive congestion. ?The patient's symptoms did improve and he was able to be discharged home with suggestion of follow-up in our office to consider outpatient cholecystectomy.  On discharge his LFTs had continued to  trend downward, with most recently alk phos of 195, AST and ALT of 29/153, total bilirubin of 1.0 on 10/16/2021. ?In the interim he has followed up with his cardiologist Dr. Raymon Mutton cardiology [place].  He underwent a repeat echocardiogram that shows ejection fraction of 40 to 45%, mild mitral valve regurgitation, mildly dilated left atrium, normal right ventricle with normal right ventricular systolic function.  His surgical risk is rated at moderate that he has been cleared by cardiology and is okay to stop his aspirin for 7 days preop. ? ?He continues to have postprandial epigastric and right upper quadrant pain, and has noted since leaving the hospital indigestion and reflux, as well as what his wife describes as sulfuric burps. ? 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 ? ?Review of Systems: ?A complete review of systems was obtained from the patient.  I have reviewed this information and discussed as appropriate with the patient.  See HPI as well for other ROS. ? ? ?Medical History: ?Past Medical History:  ?Diagnosis Date  ? Arthritis   ? Asthma, unspecified asthma severity, unspecified whether complicated, unspecified whether persistent   ? CHF (congestive heart failure) (CMS-HCC)   ? Diabetes mellitus without complication (CMS-HCC)   ? Hyperlipidemia   ? Hypertension   ? Sleep apnea   ? ? ?There is no problem list on file for this patient. ? ? ?Past Surgical History:  ?Procedure Laterality Date  ? knee surgery N/A   ? laser eye surgery N/A   ? nasal cyolectomy N/A   ?  ? ?No Known Allergies ? ?Current  Outpatient Medications on File Prior to Visit  ?Medication Sig Dispense Refill  ? atorvastatin (LIPITOR) 40 MG tablet 1 tablet    ? empagliflozin (JARDIANCE) 25 mg tablet Take by mouth    ? fluticasone propionate (FLONASE) 50 mcg/actuation nasal spray Place 1 spray into both nostrils once daily    ? FUROsemide (LASIX) 20 MG tablet Take by mouth    ?  insulin lispro (HUMALOG KWIKPEN INSULIN) 200 unit/mL (3 mL) InPn Inject subcutaneously    ? metFORMIN (GLUCOPHAGE-XR) 500 MG XR tablet 1 tablet (500 mg total) daily with evening meal.    ? metoprolol succinate (TOPROL-XL) 50 MG XL tablet Take 50 mg by mouth 2 (two) times daily    ? spironolactone (ALDACTONE) 25 MG tablet Take by mouth    ? amoxicillin-clavulanate (AUGMENTIN) 875-125 mg tablet Take 1 tablet by mouth 2 (two) times daily    ? calcium carbonate (TUMS) 200 mg calcium (500 mg) chewable tablet Take by mouth at bedtime as needed    ? FREESTYLE LIBRE 2 SENSOR kit USE ONE SENSOR DERMALLY EVERY 14 DAYS FOR 28 DAYS    ? insulin GLARGINE (LANTUS SOLOSTAR) pen injector (concentration 100 units/mL) 25 units    ? meclizine (ANTIVERT) 25 mg tablet TAKE 1 TO 2 TABLETS BY MOUTH THREE TIMES DAILY    ? neomycin-polymyxin-hydrocortisone (CORTISPORIN) otic suspension INSTILL 3 DROPS INTO RIGHT EAR IN THE MORNING AND AT BEDTIME FOR 7 DAYS    ? oxyCODONE (ROXICODONE) 5 MG immediate release tablet TAKE 1 TABLET BY MOUTH EVERY 6 HOURS AS NEEDED UP TO FOR 3 DAYS    ? ?No current facility-administered medications on file prior to visit.  ? ? ?Family History  ?Problem Relation Age of Onset  ? High blood pressure (Hypertension) Mother   ? Heart valve disease Mother   ? Diabetes Mother   ? High blood pressure (Hypertension) Father   ? Heart valve disease Father   ?  ? ?Social History  ? ?Tobacco Use  ?Smoking Status Former  ? Types: Cigarettes  ?Smokeless Tobacco Never  ?  ? ?Social History  ? ?Socioeconomic History  ? Marital status: Married  ?Tobacco Use  ? Smoking status: Former  ?  Types: Cigarettes  ? Smokeless tobacco: Never  ?Vaping Use  ? Vaping Use: Never used  ?Substance and Sexual Activity  ? Alcohol use: Yes  ? Drug use: Never  ? ? ?Objective:  ? ? ?Vitals:  ? 11/04/21 0848  ?BP: (!) 162/98  ?Pulse: 82  ?Temp: 36.8 ?C (98.3 ?F)  ?SpO2: 96%  ?Weight: (!) 143.2 kg (315 lb 12.8 oz)  ?Height: 188 cm (_0 )  ?  ?Body mass  index is 40.55 kg/m?. ? ?Gen: A&Ox3, no distress  ?Unlabored respirations ?Abdomen soft, obese, subjectively tender in the epigastrium and right upper quadrant ? ?Assessment and Plan:  ?Diagnoses and all orders for this visit: ? ?Cholecystitis ? ?Indigestion ?-     H pylori, IgM, IgG, IgA Ab - [facility] ?-     pantoprazole (PROTONIX) 40 MG DR tablet; Take 1 tablet (40 mg total) by mouth once daily ? ?Given ongoing symptoms we had a long discussion today regarding possible etiologies.  I do think at this point it is reasonable to proceed with cholecystectomy.  Using a diagram to demonstrate we went over the relevant anatomy and surgical technique.  We discussed risks of bleeding, infection, pain, scarring, injury to intra-abdominal structures specifically the common bile duct and sequelae, bile leak and sequelae,  subtotal cholecystectomy and sequelae, conversion to open surgery, failure to resolve symptoms, as well as cardiovascular/pulmonary/thromboembolic risks.  He wishes to proceed with surgery as soon as possible.  Given the reflux and malodorous belching he describes, I would also like to begin work-up for peptic ulcer disease as a possible etiology of his symptoms and we will proceed with empiric treatment with PPI and we will check an H. Pylori.  If his symptoms persist after gallbladder surgery, would recommend further GI follow-up for endoscopy. ? ?Quantel Mcinturff Raquel James, MD  ? ? ?

## 2021-11-04 NOTE — H&P (Signed)
? ? ? ?Richard Doyle ?[id number]  ? ?Referring Provider:  None ? ? ?Subjective  ? ?Chief Complaint: gallbladder  ?  ? ? ?History of Present Illness: ?   ?46 year old male following up after hospitalization last month.  He has a history of CHF/ NICM, last EF 55% in 2021, coronary artery disease, hypertension, hyperlipidemia, diabetes, obstructive sleep apnea and obesity and presented to the emergency room 2/23 with abdominal pain beginning about 12 hours prior to presentation.  This was epigastric and radiated to his chest, constant, rated as a 9 out of 10 and associated with constant nausea, emesis and diarrhea.  Pain aggravated by palpation and movement.  He denies any similar previous symptoms, no associated fever, chills, or shortness of breath.  He did initially have a low-grade temperature of 100.3 and mild tachycardia which improved with fluid resuscitation.  His lab work demonstrated a normal white count, normal lipase, and mildly elevated alk phos as well as AST and ALT in the upper 400s which did trend downward, and a bilirubin of 2.3 which remained essentially stable.  Hepatitis panel was negative.  He had an extensive imaging work-up including a CT abdomen pelvis and a right upper quadrant ultrasound which both demonstrated a thickened gallbladder wall but no gallstones.  MRCP also demonstrated gallbladder wall thickening but no gallstones or choledocholithiasis.  HIDA was then performed which was negative for acalculous cholecystitis.  ?Given negative HIDA, it was suggested that his liver dysfunction and edematous gallbladder wall may represent sequelae of his history of heart failure.  The gastroenterologist disagreed and did not think this represented a hepatocellular process or hepatic passive congestion. ?The patient's symptoms did improve and he was able to be discharged home with suggestion of follow-up in our office to consider outpatient cholecystectomy.  On discharge his LFTs had continued to  trend downward, with most recently alk phos of 195, AST and ALT of 29/153, total bilirubin of 1.0 on 10/16/2021. ?In the interim he has followed up with his cardiologist Dr. Raymon Mutton cardiology [place].  He underwent a repeat echocardiogram that shows ejection fraction of 40 to 45%, mild mitral valve regurgitation, mildly dilated left atrium, normal right ventricle with normal right ventricular systolic function.  His surgical risk is rated at moderate that he has been cleared by cardiology and is okay to stop his aspirin for 7 days preop. ? ?He continues to have postprandial epigastric and right upper quadrant pain, and has noted since leaving the hospital indigestion and reflux, as well as what his wife describes as sulfuric burps. ? 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 ? ?Review of Systems: ?A complete review of systems was obtained from the patient.  I have reviewed this information and discussed as appropriate with the patient.  See HPI as well for other ROS. ? ? ?Medical History: ?Past Medical History:  ?Diagnosis Date  ? Arthritis   ? Asthma, unspecified asthma severity, unspecified whether complicated, unspecified whether persistent   ? CHF (congestive heart failure) (CMS-HCC)   ? Diabetes mellitus without complication (CMS-HCC)   ? Hyperlipidemia   ? Hypertension   ? Sleep apnea   ? ? ?There is no problem list on file for this patient. ? ? ?Past Surgical History:  ?Procedure Laterality Date  ? knee surgery N/A   ? laser eye surgery N/A   ? nasal cyolectomy N/A   ?  ? ?No Known Allergies ? ?Current  Outpatient Medications on File Prior to Visit  ?Medication Sig Dispense Refill  ? atorvastatin (LIPITOR) 40 MG tablet 1 tablet    ? empagliflozin (JARDIANCE) 25 mg tablet Take by mouth    ? fluticasone propionate (FLONASE) 50 mcg/actuation nasal spray Place 1 spray into both nostrils once daily    ? FUROsemide (LASIX) 20 MG tablet Take by mouth    ?  insulin lispro (HUMALOG KWIKPEN INSULIN) 200 unit/mL (3 mL) InPn Inject subcutaneously    ? metFORMIN (GLUCOPHAGE-XR) 500 MG XR tablet 1 tablet (500 mg total) daily with evening meal.    ? metoprolol succinate (TOPROL-XL) 50 MG XL tablet Take 50 mg by mouth 2 (two) times daily    ? spironolactone (ALDACTONE) 25 MG tablet Take by mouth    ? amoxicillin-clavulanate (AUGMENTIN) 875-125 mg tablet Take 1 tablet by mouth 2 (two) times daily    ? calcium carbonate (TUMS) 200 mg calcium (500 mg) chewable tablet Take by mouth at bedtime as needed    ? FREESTYLE LIBRE 2 SENSOR kit USE ONE SENSOR DERMALLY EVERY 14 DAYS FOR 28 DAYS    ? insulin GLARGINE (LANTUS SOLOSTAR) pen injector (concentration 100 units/mL) 25 units    ? meclizine (ANTIVERT) 25 mg tablet TAKE 1 TO 2 TABLETS BY MOUTH THREE TIMES DAILY    ? neomycin-polymyxin-hydrocortisone (CORTISPORIN) otic suspension INSTILL 3 DROPS INTO RIGHT EAR IN THE MORNING AND AT BEDTIME FOR 7 DAYS    ? oxyCODONE (ROXICODONE) 5 MG immediate release tablet TAKE 1 TABLET BY MOUTH EVERY 6 HOURS AS NEEDED UP TO FOR 3 DAYS    ? ?No current facility-administered medications on file prior to visit.  ? ? ?Family History  ?Problem Relation Age of Onset  ? High blood pressure (Hypertension) Mother   ? Heart valve disease Mother   ? Diabetes Mother   ? High blood pressure (Hypertension) Father   ? Heart valve disease Father   ?  ? ?Social History  ? ?Tobacco Use  ?Smoking Status Former  ? Types: Cigarettes  ?Smokeless Tobacco Never  ?  ? ?Social History  ? ?Socioeconomic History  ? Marital status: Married  ?Tobacco Use  ? Smoking status: Former  ?  Types: Cigarettes  ? Smokeless tobacco: Never  ?Vaping Use  ? Vaping Use: Never used  ?Substance and Sexual Activity  ? Alcohol use: Yes  ? Drug use: Never  ? ? ?Objective:  ? ? ?Vitals:  ? 11/04/21 0848  ?BP: (!) 162/98  ?Pulse: 82  ?Temp: 36.8 ?C (98.3 ?F)  ?SpO2: 96%  ?Weight: (!) 143.2 kg (315 lb 12.8 oz)  ?Height: 188 cm (_0 )  ?  ?Body mass  index is 40.55 kg/m?. ? ?Gen: A&Ox3, no distress  ?Unlabored respirations ?Abdomen soft, obese, subjectively tender in the epigastrium and right upper quadrant ? ?Assessment and Plan:  ?Diagnoses and all orders for this visit: ? ?Cholecystitis ? ?Indigestion ?-     H pylori, IgM, IgG, IgA Ab - [facility] ?-     pantoprazole (PROTONIX) 40 MG DR tablet; Take 1 tablet (40 mg total) by mouth once daily ? ?Given ongoing symptoms we had a long discussion today regarding possible etiologies.  I do think at this point it is reasonable to proceed with cholecystectomy.  Using a diagram to demonstrate we went over the relevant anatomy and surgical technique.  We discussed risks of bleeding, infection, pain, scarring, injury to intra-abdominal structures specifically the common bile duct and sequelae, bile leak and sequelae,  subtotal cholecystectomy and sequelae, conversion to open surgery, failure to resolve symptoms, as well as cardiovascular/pulmonary/thromboembolic risks.  He wishes to proceed with surgery as soon as possible.  Given the reflux and malodorous belching he describes, I would also like to begin work-up for peptic ulcer disease as a possible etiology of his symptoms and we will proceed with empiric treatment with PPI and we will check an H. Pylori.  If his symptoms persist after gallbladder surgery, would recommend further GI follow-up for endoscopy. ? ?Richard Doyle Raquel James, MD  ? ? ?

## 2021-11-09 ENCOUNTER — Encounter (HOSPITAL_COMMUNITY): Payer: Self-pay

## 2021-11-09 ENCOUNTER — Other Ambulatory Visit: Payer: Self-pay

## 2021-11-09 ENCOUNTER — Encounter (HOSPITAL_COMMUNITY)
Admission: RE | Admit: 2021-11-09 | Discharge: 2021-11-09 | Disposition: A | Discharge status: Home / Self Care | Payer: BC Managed Care – PPO | Source: Ambulatory Visit | Attending: Surgery | Admitting: Surgery

## 2021-11-09 VITALS — BP 166/92 | HR 89 | Temp 97.7°F | Resp 16 | Ht 74.0 in | Wt 314.0 lb

## 2021-11-09 DIAGNOSIS — K811 Chronic cholecystitis: Secondary | ICD-10-CM | POA: Diagnosis present

## 2021-11-09 DIAGNOSIS — Z01812 Encounter for preprocedural laboratory examination: Secondary | ICD-10-CM | POA: Insufficient documentation

## 2021-11-09 DIAGNOSIS — I509 Heart failure, unspecified: Secondary | ICD-10-CM | POA: Insufficient documentation

## 2021-11-09 DIAGNOSIS — Z6841 Body Mass Index (BMI) 40.0 and over, adult: Secondary | ICD-10-CM | POA: Diagnosis not present

## 2021-11-09 DIAGNOSIS — E782 Mixed hyperlipidemia: Secondary | ICD-10-CM | POA: Insufficient documentation

## 2021-11-09 DIAGNOSIS — I110 Hypertensive heart disease with heart failure: Secondary | ICD-10-CM | POA: Insufficient documentation

## 2021-11-09 DIAGNOSIS — E119 Type 2 diabetes mellitus without complications: Secondary | ICD-10-CM | POA: Insufficient documentation

## 2021-11-09 DIAGNOSIS — J45909 Unspecified asthma, uncomplicated: Secondary | ICD-10-CM | POA: Insufficient documentation

## 2021-11-09 DIAGNOSIS — R7401 Elevation of levels of liver transaminase levels: Secondary | ICD-10-CM | POA: Insufficient documentation

## 2021-11-09 DIAGNOSIS — G4730 Sleep apnea, unspecified: Secondary | ICD-10-CM | POA: Diagnosis not present

## 2021-11-09 DIAGNOSIS — Z87891 Personal history of nicotine dependence: Secondary | ICD-10-CM | POA: Insufficient documentation

## 2021-11-09 DIAGNOSIS — Z79899 Other long term (current) drug therapy: Secondary | ICD-10-CM | POA: Diagnosis not present

## 2021-11-09 DIAGNOSIS — Z7984 Long term (current) use of oral hypoglycemic drugs: Secondary | ICD-10-CM | POA: Diagnosis not present

## 2021-11-09 DIAGNOSIS — Z794 Long term (current) use of insulin: Secondary | ICD-10-CM | POA: Insufficient documentation

## 2021-11-09 DIAGNOSIS — J449 Chronic obstructive pulmonary disease, unspecified: Secondary | ICD-10-CM | POA: Diagnosis not present

## 2021-11-09 DIAGNOSIS — K819 Cholecystitis, unspecified: Secondary | ICD-10-CM | POA: Insufficient documentation

## 2021-11-09 DIAGNOSIS — K8010 Calculus of gallbladder with chronic cholecystitis without obstruction: Secondary | ICD-10-CM | POA: Diagnosis not present

## 2021-11-09 DIAGNOSIS — K810 Acute cholecystitis: Secondary | ICD-10-CM

## 2021-11-09 DIAGNOSIS — K219 Gastro-esophageal reflux disease without esophagitis: Secondary | ICD-10-CM | POA: Diagnosis not present

## 2021-11-09 DIAGNOSIS — I340 Nonrheumatic mitral (valve) insufficiency: Secondary | ICD-10-CM | POA: Diagnosis not present

## 2021-11-09 HISTORY — DX: Sleep apnea, unspecified: G47.30

## 2021-11-09 HISTORY — DX: Gastro-esophageal reflux disease without esophagitis: K21.9

## 2021-11-09 LAB — CBC WITH DIFFERENTIAL/PLATELET
Abs Immature Granulocytes: 0.03 10*3/uL (ref 0.00–0.07)
Basophils Absolute: 0 10*3/uL (ref 0.0–0.1)
Basophils Relative: 0 %
Eosinophils Absolute: 0.2 10*3/uL (ref 0.0–0.5)
Eosinophils Relative: 2 %
HCT: 42.4 % (ref 39.0–52.0)
Hemoglobin: 14.4 g/dL (ref 13.0–17.0)
Immature Granulocytes: 0 %
Lymphocytes Relative: 23 %
Lymphs Abs: 2 10*3/uL (ref 0.7–4.0)
MCH: 29.4 pg (ref 26.0–34.0)
MCHC: 34 g/dL (ref 30.0–36.0)
MCV: 86.7 fL (ref 80.0–100.0)
Monocytes Absolute: 0.7 10*3/uL (ref 0.1–1.0)
Monocytes Relative: 8 %
Neutro Abs: 5.8 10*3/uL (ref 1.7–7.7)
Neutrophils Relative %: 67 %
Platelets: 275 10*3/uL (ref 150–400)
RBC: 4.89 MIL/uL (ref 4.22–5.81)
RDW: 13.8 % (ref 11.5–15.5)
WBC: 8.7 10*3/uL (ref 4.0–10.5)
nRBC: 0 % (ref 0.0–0.2)

## 2021-11-09 LAB — COMPREHENSIVE METABOLIC PANEL
ALT: 26 U/L (ref 0–44)
AST: 17 U/L (ref 15–41)
Albumin: 3.9 g/dL (ref 3.5–5.0)
Alkaline Phosphatase: 86 U/L (ref 38–126)
Anion gap: 6 (ref 5–15)
BUN: 14 mg/dL (ref 6–20)
CO2: 27 mmol/L (ref 22–32)
Calcium: 8.4 mg/dL — ABNORMAL LOW (ref 8.9–10.3)
Chloride: 106 mmol/L (ref 98–111)
Creatinine, Ser: 0.95 mg/dL (ref 0.61–1.24)
GFR, Estimated: >60 mL/min (ref >60)
Glucose, Bld: 185 mg/dL — ABNORMAL HIGH (ref 70–99)
Potassium: 3.7 mmol/L (ref 3.5–5.1)
Sodium: 139 mmol/L (ref 135–145)
Total Bilirubin: 0.6 mg/dL (ref 0.3–1.2)
Total Protein: 6.8 g/dL (ref 6.5–8.1)

## 2021-11-09 LAB — GLUCOSE, CAPILLARY: Glucose-Capillary: 191 mg/dL — ABNORMAL HIGH (ref 70–99)

## 2021-11-09 LAB — HEMOGLOBIN A1C
Hgb A1c MFr Bld: 9.2 % — ABNORMAL HIGH (ref 4.8–5.6)
Mean Plasma Glucose: 217.34 mg/dL

## 2021-11-09 NOTE — Patient Instructions (Addendum)
DUE TO COVID-19 ONLY ONE VISITOR  (aged 48 and older)  IS ALLOWED TO COME WITH YOU AND STAY IN THE WAITING ROOM ONLY DURING PRE OP AND PROCEDURE.   ?**NO VISITORS ARE ALLOWED IN THE SHORT STAY AREA OR RECOVERY ROOM!!**  ? ? Your procedure is scheduled on: 11/10/21 ? ? Report to [facility] Main Entrance ? ?  Report to admitting at 8:15 AM ? ? Call this number if you have problems the morning of surgery [phone] ? ? Do not eat food :After Midnight. ? ? After Midnight you may have the following liquids until 7:30 AM DAY OF SURGERY ? ?Water ?Black Coffee (sugar ok, NO MILK/CREAM OR CREAMERS)  ?Tea (sugar ok, NO MILK/CREAM OR CREAMERS) regular and decaf                             ?Plain Jell-O (NO RED)                                           ?Fruit ices (not with fruit pulp, NO RED)                                     ?Popsicles (NO RED)                                                                  ?Juice: apple, WHITE grape, WHITE cranberry ?Sports drinks like Gatorade (NO RED) ?Clear broth(vegetable,chicken,beef) ? ?FOLLOW BOWEL PREP AND ANY ADDITIONAL PRE OP INSTRUCTIONS YOU RECEIVED FROM YOUR SURGEON'S OFFICE!!! ?  ?  ?Oral Hygiene is also important to reduce your risk of infection.                                    ?Remember - BRUSH YOUR TEETH THE MORNING OF SURGERY WITH YOUR REGULAR TOOTHPASTE ? ? Take these medicines the morning of surgery with A SIP OF WATER: Flonase, Metoprolol.  ? ?DO NOT TAKE ANY ORAL DIABETIC MEDICATIONS DAY OF YOUR SURGERY ? ?How to Manage Your Diabetes ?Before and After Surgery ? ?Why is it important to control my blood sugar before and after surgery? ?Improving blood sugar levels before and after surgery helps healing and can limit problems. ?A way of improving blood sugar control is eating a healthy diet by: ? Eating less sugar and carbohydrates ? Increasing activity/exercise ? Talking with your doctor about reaching your blood sugar goals ?High blood sugars (greater  than 180 mg/dL) can raise your risk of infections and slow your recovery, so you will need to focus on controlling your diabetes during the weeks before surgery. ?Make sure that the doctor who takes care of your diabetes knows about your planned surgery including the date and location. ? ?How do I manage my blood sugar before surgery? ?Check your blood sugar at least 4 times a day, starting 2 days before surgery, to make sure that the level is not too high or low. ?  Check your blood sugar the morning of your surgery when you wake up and every 2 hours until you get to the Short Stay unit. ?If your blood sugar is less than 70 mg/dL, you will need to treat for low blood sugar: ?Do not take insulin. ?Treat a low blood sugar (less than 70 mg/dL) with ? cup of clear juice (cranberry or apple), 4 glucose tablets, OR glucose gel. ?Recheck blood sugar in 15 minutes after treatment (to make sure it is greater than 70 mg/dL). If your blood sugar is not greater than 70 mg/dL on recheck, call [phone] for further instructions. ?Report your blood sugar to the short stay nurse when you get to Short Stay. ? ?If you are admitted to the hospital after surgery: ?Your blood sugar will be checked by the staff and you will probably be given insulin after surgery (instead of oral diabetes medicines) to make sure you have good blood sugar levels. ?The goal for blood sugar control after surgery is 80-180 mg/dL. ? ? ?WHAT DO I DO ABOUT MY DIABETES MEDICATION? ? ?Do not take oral diabetes medicines (pills) the morning of surgery. ? ?THE DAY BEFORE SURGERY, take Lantus, Humalog, and Metformin as prescribed. Do not take Jardiance.    ? ? ?THE MORNING OF SURGERY, do not take Metformin or Jardiance. Take 50% of Lantus. Do not take Humalog unless blood sugar is greater than 220, then take 50 % of dose. ? ?The day of surgery, do not take other diabetes injectables, including Byetta (exenatide), Bydureon (exenatide ER), Victoza (liraglutide), or  Trulicity (dulaglutide). ? ?If your CBG is greater than 220 mg/dL, you may take ? of your sliding scale  ?(correction) dose of insulin. ?   ?Reviewed and Endorsed by [facility] Patient Education Committee, August 2015  ? ?Bring CPAP mask and tubing day of surgery. ?                  ?           You may not have any metal on your body including jewelry, and body piercing ? ?           Do not wear lotions, powders, cologne, or deodorant  ? ?            Men may shave face and neck. ? ? Do not bring valuables to the hospital. [facility] IS NOT ?            RESPONSIBLE   FOR VALUABLES. ?  ? Patients discharged on the day of surgery will not be allowed to drive home.  Someone NEEDS to stay with you for the first 24 hours after anesthesia. ? ?            Please read over the following fact sheets you were given: IF YOU HAVE QUESTIONS ABOUT YOUR PRE-OP INSTRUCTIONS PLEASE CALL [phone]- Fleet ContrasRachel ? ?   [facility] - Preparing for Surgery ?Before surgery, you can play an important role.  Because skin is not sterile, your skin needs to be as free of germs as possible.  You can reduce the number of germs on your skin by washing with CHG (chlorahexidine gluconate) soap before surgery.  CHG is an antiseptic cleaner which kills germs and bonds with the skin to continue killing germs even after washing. ?Please DO NOT use if you have an allergy to CHG or antibacterial soaps.  If your skin becomes reddened/irritated stop using the CHG and inform your nurse when  you arrive at Short Stay. ?Do not shave (including legs and underarms) for at least 48 hours prior to the first CHG shower.  You may shave your face/neck. ? ?Please follow these instructions carefully: ? 1.  Shower with CHG Soap the night before surgery and the  morning of surgery. ? 2.  If you choose to wash your hair, wash your hair first as usual with your normal  shampoo. ? 3.  After you shampoo, rinse your hair and body thoroughly to remove the shampoo.                             ? 4.  Use CHG as you would any other liquid soap.  You can apply chg directly to the skin and wash.  Gently with a scrungie or clean washcloth. ? 5.  Apply the CHG Soap to your body ONLY FROM THE NECK DOWN.   Do   not use on face/ open      ?                     Wound or open sores. Avoid contact with eyes, ears mouth and   genitals (private parts).  ?                     Engineering geologist,  Genitals (private parts) with your normal soap. ?            6.  Wash thoroughly, paying special attention to the area where your    surgery  will be performed. ? 7.  Thoroughly rinse your body with warm water from the neck down. ? 8.  DO NOT shower/wash with your normal soap after using and rinsing off the CHG Soap. ?               9.  Pat yourself dry with a clean towel. ?           10.  Wear clean pajamas. ?           11.  Place clean sheets on your bed the night of your first shower and do not  sleep with pets. ?Day of Surgery : ?Do not apply any lotions/deodorants the morning of surgery.  Please wear clean clothes to the hospital/surgery center. ? ?FAILURE TO FOLLOW THESE INSTRUCTIONS MAY RESULT IN THE CANCELLATION OF YOUR SURGERY ? ?PATIENT SIGNATURE_________________________________ ? ?NURSE SIGNATURE__________________________________ ? ?________________________________________________________________________  ?WHAT IS A BLOOD TRANSFUSION? Blood Transfusion Information ? ?A transfusion is the replacement of blood or some of its parts. Blood is made up of multiple cells which provide different functions. ?Red blood cells carry oxygen and are used for blood loss replacement. ?White blood cells fight against infection. ?Platelets control bleeding. ?Plasma helps clot blood. ?Other blood products are available for specialized needs, such as hemophilia or other clotting disorders. ?BEFORE THE TRANSFUSION  ?Who gives blood for transfusions?  ?Healthy volunteers who are fully evaluated to make sure their blood is safe. This  is blood bank blood. ?Transfusion therapy is the safest it has ever been in the practice of medicine. Before blood is taken from a donor, a complete history is taken to make sure that person has no his

## 2021-11-09 NOTE — Progress Notes (Signed)
Anesthesia Chart Review ? ? Case: [id number] Date/Time: 11/10/21 1015  ? Procedure: LAPAROSCOPIC CHOLECYSTECTOMY  ? Anesthesia type: General  ? Pre-op diagnosis: CHOLECYSTITIS  ? Location: [facility] ROOM 01 / [facility]  ? Surgeons: Berna Bue, MD  ? ?  ? ? ?DISCUSSION:46 y.o. former smoker with h/o asthma, HTN, DM II, sleep apnea, CHF, cholecystitis scheduled for above procedure 11/10/21 with Dr. Phylliss Blakes.  ? ?Pt with recent admission 2/22-2/25/2023 with RUQ pain.  ? ?Pt last seen by cardiology 10/26/2021.  Stable at this visit, euvolemic, BP controlled.  ? ?Evaluate blood glucose DOS.  ?VS: BP (!) 166/92   Pulse 89   Temp 36.5 ?C (Oral)   Resp 16   Ht 6\' 2"  (1.88 m)   Wt (!) 142.4 kg   SpO2 98%   BMI 40.32 kg/m?  ? ?PROVIDERS: ?[place], , MD is PCP  ? ? ?LABS: Labs reviewed: Acceptable for surgery. ?(all labs ordered are listed, but only abnormal results are displayed) ? ?Labs Reviewed  ?GLUCOSE, CAPILLARY - Abnormal; Notable for the following components:  ?    Result Value  ? Glucose-Capillary 191 (*)   ? All other components within normal limits  ?CBC WITH DIFFERENTIAL/PLATELET  ?COMPREHENSIVE METABOLIC PANEL  ?HEMOGLOBIN A1C  ?TYPE AND SCREEN  ? ? ? ?IMAGES: ? ? ?EKG: ? ? ?CV: ?Echo 10/28/2021 ?Summary  ?  1. The left ventricle is mildly dilated in size with moderately increased  ?wall thickness.  ?  2. The left ventricular systolic function is mildly decreased, LVEF is  ?visually estimated at 40-45%.  ?  3. There is mild mitral valve regurgitation.  ?  4. The left atrium is moderately dilated in size.  ?  5. The right ventricle is normal in size, with normal systolic function.  ?Past Medical History:  ?Diagnosis Date  ? Arthritis   ? Asthma   ? Congestive heart failure (CHF) (HCC)   ? Diabetes mellitus without complication (HCC)   ? GERD (gastroesophageal reflux disease)   ? Hypertension   ? Mixed hyperlipidemia   ? Sleep apnea   ? ? ?Past Surgical History:  ?Procedure Laterality Date  ? EUSTACHIAN  TUBE DILATION    ? KNEE SURGERY    ? x3  ? REFRACTIVE SURGERY    ? ? ?MEDICATIONS: ? fluticasone (FLONASE) 50 MCG/ACT nasal spray  ? furosemide (LASIX) 20 MG tablet  ? insulin glargine (LANTUS SOLOSTAR) 100 UNIT/ML Solostar Pen  ? insulin lispro (HUMALOG KWIKPEN) 200 UNIT/ML KwikPen  ? JARDIANCE 25 MG TABS tablet  ? metFORMIN (GLUCOPHAGE-XR) 500 MG 24 hr tablet  ? metoprolol succinate (TOPROL-XL) 50 MG 24 hr tablet  ? sacubitril-valsartan (ENTRESTO) 97-103 MG  ? Semaglutide, 1 MG/DOSE, (OZEMPIC, 1 MG/DOSE,) 4 MG/3ML SOPN  ? spironolactone (ALDACTONE) 25 MG tablet  ? ?No current facility-administered medications for this encounter.  ? ? ?12/28/2021 Ward, PA-C ?[facility] ?[phone] ? ? ? ? ? ? ?

## 2021-11-09 NOTE — Progress Notes (Addendum)
COVID Vaccine Completed: no ?Date COVID Vaccine completed: ?Has received booster: ?COVID vaccine manufacturer: [employer]  ? ?Date of COVID positive in last 90 days: no ? ?PCP - Quintin Alto, MD ?Cardiologist - Hubert Azure, MD ? ?Clearance?? ? ?Chest x-ray - 08/02/21 Epic ?EKG - 10/13/21 Epic ?Stress Test - n/a ?ECHO - 10/28/21 CE ?Cardiac Cath - 04/01/19 CE ?Pacemaker/ICD device last checked: n/a ?Spinal Cord Stimulator:n/a ? ?Bowel Prep - no ? ?Sleep Study - yes, positive ?CPAP - yes every night, pt will bring mask and tubing ? ?Fasting Blood Sugar - 170-191 ?Checks Blood Sugar 2 times a day ? ?Blood Thinner Instructions: n/a ?Aspirin Instructions: ?Last Dose: ? ?Activity level: Can go up a flight of stairs and perform activities of daily living without stopping and without symptoms of chest pain or shortness of breath.   ? ?Anesthesia review: CAD, CHF, DM2, HTN, OSA ? ?Patient denies shortness of breath, fever, cough and chest pain at PAT appointment ? ? ?Patient verbalized understanding of instructions that were given to them at the PAT appointment. Patient was also instructed that they will need to review over the PAT instructions again at home before surgery.  ?

## 2021-11-10 ENCOUNTER — Ambulatory Visit (HOSPITAL_COMMUNITY): Payer: BC Managed Care – PPO | Admitting: Anesthesiology

## 2021-11-10 ENCOUNTER — Other Ambulatory Visit: Payer: Self-pay

## 2021-11-10 ENCOUNTER — Encounter (HOSPITAL_COMMUNITY): Payer: Self-pay | Admitting: Surgery

## 2021-11-10 ENCOUNTER — Encounter (HOSPITAL_COMMUNITY)
Admission: RE | Disposition: A | Discharge status: Home / Self Care | Payer: Self-pay | Source: Home / Self Care | Attending: Surgery

## 2021-11-10 ENCOUNTER — Ambulatory Visit (HOSPITAL_COMMUNITY)
Admission: RE | Admit: 2021-11-10 | Discharge: 2021-11-10 | Disposition: A | Discharge status: Home / Self Care | Payer: BC Managed Care – PPO | Attending: Surgery | Admitting: Surgery

## 2021-11-10 ENCOUNTER — Ambulatory Visit (HOSPITAL_COMMUNITY): Payer: BC Managed Care – PPO

## 2021-11-10 DIAGNOSIS — Z87891 Personal history of nicotine dependence: Secondary | ICD-10-CM | POA: Diagnosis not present

## 2021-11-10 DIAGNOSIS — I110 Hypertensive heart disease with heart failure: Secondary | ICD-10-CM | POA: Insufficient documentation

## 2021-11-10 DIAGNOSIS — Z7984 Long term (current) use of oral hypoglycemic drugs: Secondary | ICD-10-CM | POA: Insufficient documentation

## 2021-11-10 DIAGNOSIS — R7401 Elevation of levels of liver transaminase levels: Secondary | ICD-10-CM | POA: Insufficient documentation

## 2021-11-10 DIAGNOSIS — G4730 Sleep apnea, unspecified: Secondary | ICD-10-CM | POA: Diagnosis not present

## 2021-11-10 DIAGNOSIS — K811 Chronic cholecystitis: Secondary | ICD-10-CM | POA: Diagnosis not present

## 2021-11-10 DIAGNOSIS — K219 Gastro-esophageal reflux disease without esophagitis: Secondary | ICD-10-CM | POA: Diagnosis not present

## 2021-11-10 DIAGNOSIS — I340 Nonrheumatic mitral (valve) insufficiency: Secondary | ICD-10-CM | POA: Insufficient documentation

## 2021-11-10 DIAGNOSIS — Z79899 Other long term (current) drug therapy: Secondary | ICD-10-CM | POA: Diagnosis not present

## 2021-11-10 DIAGNOSIS — E782 Mixed hyperlipidemia: Secondary | ICD-10-CM | POA: Insufficient documentation

## 2021-11-10 DIAGNOSIS — J449 Chronic obstructive pulmonary disease, unspecified: Secondary | ICD-10-CM | POA: Insufficient documentation

## 2021-11-10 DIAGNOSIS — K8010 Calculus of gallbladder with chronic cholecystitis without obstruction: Secondary | ICD-10-CM | POA: Diagnosis not present

## 2021-11-10 DIAGNOSIS — Z794 Long term (current) use of insulin: Secondary | ICD-10-CM | POA: Insufficient documentation

## 2021-11-10 DIAGNOSIS — Z6841 Body Mass Index (BMI) 40.0 and over, adult: Secondary | ICD-10-CM | POA: Insufficient documentation

## 2021-11-10 DIAGNOSIS — I509 Heart failure, unspecified: Secondary | ICD-10-CM | POA: Diagnosis not present

## 2021-11-10 DIAGNOSIS — E119 Type 2 diabetes mellitus without complications: Secondary | ICD-10-CM | POA: Insufficient documentation

## 2021-11-10 HISTORY — PX: CHOLECYSTECTOMY: SHX55

## 2021-11-10 LAB — GLUCOSE, CAPILLARY
Glucose-Capillary: 162 mg/dL — ABNORMAL HIGH (ref 70–99)
Glucose-Capillary: 145 mg/dL — ABNORMAL HIGH (ref 70–99)
Glucose-Capillary: 174 mg/dL — ABNORMAL HIGH (ref 70–99)

## 2021-11-10 LAB — ABO/RH: ABO/RH(D): A POS

## 2021-11-10 SURGERY — LAPAROSCOPIC CHOLECYSTECTOMY
Anesthesia: General

## 2021-11-10 MED ORDER — MEPERIDINE HCL 50 MG/ML IJ SOLN: 6.2500 mg | INTRAMUSCULAR | Status: DC | PRN

## 2021-11-10 MED ORDER — SUCCINYLCHOLINE CHLORIDE 200 MG/10ML IV SOSY
PREFILLED_SYRINGE | INTRAVENOUS | Status: DC | PRN
  Administered 2021-11-10: 200 mg via INTRAVENOUS

## 2021-11-10 MED ORDER — CHLORHEXIDINE GLUCONATE 0.12 % MT SOLN
15.0000 mL | Freq: Once | OROMUCOSAL | Status: AC
  Administered 2021-11-10: 15 mL via OROMUCOSAL

## 2021-11-10 MED ORDER — SODIUM CHLORIDE 0.9% FLUSH: 3.0000 mL | INTRAVENOUS | Status: DC | PRN

## 2021-11-10 MED ORDER — GABAPENTIN 300 MG PO CAPS
300.0000 mg | ORAL_CAPSULE | ORAL | Status: AC
  Administered 2021-11-10: 300 mg via ORAL
  Filled 2021-11-10: qty 1

## 2021-11-10 MED ORDER — CEFAZOLIN SODIUM-DEXTROSE 2-4 GM/100ML-% IV SOLN
2.0000 g | INTRAVENOUS | Status: AC
  Administered 2021-11-10: 2 g via INTRAVENOUS
  Filled 2021-11-10: qty 100

## 2021-11-10 MED ORDER — ONDANSETRON HCL 4 MG/2ML IJ SOLN
INTRAMUSCULAR | Status: DC | PRN
  Administered 2021-11-10: 4 mg via INTRAVENOUS

## 2021-11-10 MED ORDER — FENTANYL CITRATE (PF) 250 MCG/5ML IJ SOLN
INTRAMUSCULAR | Status: AC
  Filled 2021-11-10: qty 5

## 2021-11-10 MED ORDER — BUPIVACAINE LIPOSOME 1.3 % IJ SUSP
INTRAMUSCULAR | Status: AC
  Filled 2021-11-10: qty 20

## 2021-11-10 MED ORDER — MIDAZOLAM HCL 2 MG/2ML IJ SOLN: 0.5000 mg | Freq: Once | INTRAMUSCULAR | Status: DC | PRN

## 2021-11-10 MED ORDER — PROPOFOL 10 MG/ML IV BOLUS
INTRAVENOUS | Status: DC | PRN
Start: 2021-11-10 — End: 2021-11-10
  Administered 2021-11-10: 150 mg via INTRAVENOUS

## 2021-11-10 MED ORDER — HYDROMORPHONE HCL 1 MG/ML IJ SOLN
0.2500 mg | INTRAMUSCULAR | Status: DC | PRN
  Administered 2021-11-10 (×3): 0.5 mg via INTRAVENOUS

## 2021-11-10 MED ORDER — BUPIVACAINE LIPOSOME 1.3 % IJ SUSP: 20.0000 mL | Freq: Once | INTRAMUSCULAR | Status: DC

## 2021-11-10 MED ORDER — ORAL CARE MOUTH RINSE: 15.0000 mL | Freq: Once | OROMUCOSAL | Status: AC

## 2021-11-10 MED ORDER — SODIUM CHLORIDE 0.9 % IV SOLN: 250.0000 mL | INTRAVENOUS | Status: DC | PRN

## 2021-11-10 MED ORDER — BUPIVACAINE-EPINEPHRINE 0.25% -1:200000 IJ SOLN
INTRAMUSCULAR | Status: DC | PRN
  Administered 2021-11-10: 30 mL

## 2021-11-10 MED ORDER — SUGAMMADEX SODIUM 500 MG/5ML IV SOLN
INTRAVENOUS | Status: AC
  Filled 2021-11-10: qty 5

## 2021-11-10 MED ORDER — ACETAMINOPHEN 325 MG PO TABS
650.0000 mg | ORAL_TABLET | ORAL | Status: DC | PRN
Start: 2021-11-10 — End: 2021-11-10

## 2021-11-10 MED ORDER — 0.9 % SODIUM CHLORIDE (POUR BTL) OPTIME
TOPICAL | Status: DC | PRN
  Administered 2021-11-10: 1000 mL

## 2021-11-10 MED ORDER — HYDROMORPHONE HCL 1 MG/ML IJ SOLN
INTRAMUSCULAR | Status: AC
  Administered 2021-11-10: 0.5 mg via INTRAVENOUS
  Filled 2021-11-10: qty 1

## 2021-11-10 MED ORDER — SUGAMMADEX SODIUM 500 MG/5ML IV SOLN
INTRAVENOUS | Status: DC | PRN
  Administered 2021-11-10: 400 mg via INTRAVENOUS

## 2021-11-10 MED ORDER — DOCUSATE SODIUM 100 MG PO CAPS: 100.0000 mg | ORAL_CAPSULE | Freq: Two times a day (BID) | ORAL | 0 refills | Status: AC

## 2021-11-10 MED ORDER — OXYCODONE HCL 5 MG PO TABS
ORAL_TABLET | ORAL | Status: AC
  Filled 2021-11-10: qty 1

## 2021-11-10 MED ORDER — MIDAZOLAM HCL 2 MG/2ML IJ SOLN
INTRAMUSCULAR | Status: AC
  Filled 2021-11-10: qty 2

## 2021-11-10 MED ORDER — DEXAMETHASONE SODIUM PHOSPHATE 10 MG/ML IJ SOLN
INTRAMUSCULAR | Status: DC | PRN
Start: 2021-11-10 — End: 2021-11-10
  Administered 2021-11-10: 4 mg via INTRAVENOUS

## 2021-11-10 MED ORDER — OXYCODONE HCL 5 MG/5ML PO SOLN: 5.0000 mg | Freq: Once | ORAL | Status: AC | PRN

## 2021-11-10 MED ORDER — LACTATED RINGERS IV SOLN: INTRAVENOUS | Status: DC

## 2021-11-10 MED ORDER — ACETAMINOPHEN 650 MG RE SUPP
650.0000 mg | RECTAL | Status: DC | PRN
  Filled 2021-11-10: qty 1

## 2021-11-10 MED ORDER — FENTANYL CITRATE PF 50 MCG/ML IJ SOSY: 25.0000 ug | PREFILLED_SYRINGE | INTRAMUSCULAR | Status: DC | PRN

## 2021-11-10 MED ORDER — ROCURONIUM BROMIDE 10 MG/ML (PF) SYRINGE
PREFILLED_SYRINGE | INTRAVENOUS | Status: DC | PRN
  Administered 2021-11-10: 10 mg via INTRAVENOUS
  Administered 2021-11-10: 60 mg via INTRAVENOUS

## 2021-11-10 MED ORDER — FENTANYL CITRATE (PF) 100 MCG/2ML IJ SOLN
INTRAMUSCULAR | Status: DC | PRN
  Administered 2021-11-10: 150 ug via INTRAVENOUS
  Administered 2021-11-10 (×2): 50 ug via INTRAVENOUS

## 2021-11-10 MED ORDER — LIDOCAINE 2% (20 MG/ML) 5 ML SYRINGE
INTRAMUSCULAR | Status: DC | PRN
  Administered 2021-11-10: 80 mg via INTRAVENOUS

## 2021-11-10 MED ORDER — ACETAMINOPHEN 500 MG PO TABS
1000.0000 mg | ORAL_TABLET | ORAL | Status: AC
  Administered 2021-11-10: 1000 mg via ORAL
  Filled 2021-11-10: qty 2

## 2021-11-10 MED ORDER — SODIUM CHLORIDE 0.9% FLUSH: 3.0000 mL | Freq: Two times a day (BID) | INTRAVENOUS | Status: DC

## 2021-11-10 MED ORDER — OXYCODONE HCL 5 MG PO TABS
5.0000 mg | ORAL_TABLET | Freq: Once | ORAL | Status: AC | PRN
  Administered 2021-11-10: 5 mg via ORAL

## 2021-11-10 MED ORDER — CHLORHEXIDINE GLUCONATE 4 % EX LIQD: 60.0000 mL | Freq: Once | CUTANEOUS | Status: DC

## 2021-11-10 MED ORDER — DEXMEDETOMIDINE (PRECEDEX) IN NS 20 MCG/5ML (4 MCG/ML) IV SYRINGE
PREFILLED_SYRINGE | INTRAVENOUS | Status: DC | PRN
  Administered 2021-11-10: 4 ug via INTRAVENOUS

## 2021-11-10 MED ORDER — DEXMEDETOMIDINE (PRECEDEX) IN NS 20 MCG/5ML (4 MCG/ML) IV SYRINGE
PREFILLED_SYRINGE | INTRAVENOUS | Status: AC
  Filled 2021-11-10: qty 10

## 2021-11-10 MED ORDER — BUPIVACAINE-EPINEPHRINE (PF) 0.25% -1:200000 IJ SOLN
INTRAMUSCULAR | Status: AC
  Filled 2021-11-10: qty 30

## 2021-11-10 MED ORDER — OXYCODONE HCL 5 MG PO TABS: 5.0000 mg | ORAL_TABLET | ORAL | Status: DC | PRN

## 2021-11-10 MED ORDER — GLYCOPYRROLATE PF 0.2 MG/ML IJ SOSY
PREFILLED_SYRINGE | INTRAMUSCULAR | Status: DC | PRN
  Administered 2021-11-10: .2 mg via INTRAVENOUS

## 2021-11-10 MED ORDER — TRAMADOL HCL 50 MG PO TABS: 50.0000 mg | ORAL_TABLET | Freq: Four times a day (QID) | ORAL | 0 refills | Status: AC | PRN

## 2021-11-10 MED ORDER — PHENYLEPHRINE HCL-NACL 20-0.9 MG/250ML-% IV SOLN
INTRAVENOUS | Status: DC | PRN
  Administered 2021-11-10: 25 ug/min via INTRAVENOUS

## 2021-11-10 MED ORDER — HYDROMORPHONE HCL 1 MG/ML IJ SOLN
INTRAMUSCULAR | Status: AC
  Filled 2021-11-10: qty 1

## 2021-11-10 MED ORDER — LACTATED RINGERS IR SOLN
Status: DC | PRN
  Administered 2021-11-10: 1000 mL

## 2021-11-10 MED ORDER — PROPOFOL 10 MG/ML IV BOLUS
INTRAVENOUS | Status: AC
  Filled 2021-11-10: qty 20

## 2021-11-10 MED ORDER — MIDAZOLAM HCL 5 MG/5ML IJ SOLN
INTRAMUSCULAR | Status: DC | PRN
Start: 2021-11-10 — End: 2021-11-10
  Administered 2021-11-10: 2 mg via INTRAVENOUS

## 2021-11-10 SURGICAL SUPPLY — 39 items
APPLIER CLIP ROT 10 11.4 M/L (STAPLE) ×2
BAG COUNTER SPONGE SURGICOUNT (BAG) IMPLANT
CABLE HIGH FREQUENCY MONO STRZ (ELECTRODE) ×2 IMPLANT
CHLORAPREP W/TINT 26 (MISCELLANEOUS) ×2 IMPLANT
CLIP APPLIE ROT 10 11.4 M/L (STAPLE) ×1 IMPLANT
COVER MAYO STAND STRL (DRAPES) IMPLANT
COVER SURGICAL LIGHT HANDLE (MISCELLANEOUS) ×2 IMPLANT
DERMABOND ADVANCED (GAUZE/BANDAGES/DRESSINGS) ×1
DERMABOND ADVANCED .7 DNX12 (GAUZE/BANDAGES/DRESSINGS) ×1 IMPLANT
DRAPE C-ARM 42X120 X-RAY (DRAPES) IMPLANT
ELECT REM PT RETURN 15FT ADLT (MISCELLANEOUS) ×2 IMPLANT
GLOVE SURG ENC MOIS LTX SZ6 (GLOVE) ×2 IMPLANT
GLOVE SURG MICRO LTX SZ6 (GLOVE) ×2 IMPLANT
GLOVE SURG UNDER LTX SZ6.5 (GLOVE) ×2 IMPLANT
GOWN STRL REUS W/ TWL LRG LVL3 (GOWN DISPOSABLE) ×1 IMPLANT
GOWN STRL REUS W/ TWL XL LVL3 (GOWN DISPOSABLE) IMPLANT
GOWN STRL REUS W/TWL LRG LVL3 (GOWN DISPOSABLE) ×2
GOWN STRL REUS W/TWL XL LVL3 (GOWN DISPOSABLE)
GRASPER SUT TROCAR 14GX15 (MISCELLANEOUS) ×2 IMPLANT
HEMOSTAT SNOW SURGICEL 2X4 (HEMOSTASIS) IMPLANT
IRRIG SUCT STRYKERFLOW 2 WTIP (MISCELLANEOUS) ×2
IRRIGATION SUCT STRKRFLW 2 WTP (MISCELLANEOUS) ×1 IMPLANT
KIT BASIN OR (CUSTOM PROCEDURE TRAY) ×2 IMPLANT
KIT TURNOVER KIT A (KITS) IMPLANT
NDL INSUFFLATION 14GA 120MM (NEEDLE) ×1 IMPLANT
NEEDLE INSUFFLATION 14GA 120MM (NEEDLE) ×2 IMPLANT
PENCIL SMOKE EVACUATOR (MISCELLANEOUS) IMPLANT
POUCH SPECIMEN RETRIEVAL 10MM (ENDOMECHANICALS) ×2 IMPLANT
SCISSORS LAP 5X35 DISP (ENDOMECHANICALS) ×2 IMPLANT
SET CHOLANGIOGRAPH MIX (MISCELLANEOUS) IMPLANT
SET TUBE SMOKE EVAC HIGH FLOW (TUBING) ×2 IMPLANT
SLEEVE XCEL OPT CAN 5 100 (ENDOMECHANICALS) ×4 IMPLANT
SPIKE FLUID TRANSFER (MISCELLANEOUS) ×2 IMPLANT
SUT MNCRL AB 4-0 PS2 18 (SUTURE) ×2 IMPLANT
TOWEL OR 17X26 10 PK STRL BLUE (TOWEL DISPOSABLE) ×2 IMPLANT
TOWEL OR NON WOVEN STRL DISP B (DISPOSABLE) IMPLANT
TRAY LAPAROSCOPIC (CUSTOM PROCEDURE TRAY) ×2 IMPLANT
TROCAR BLADELESS OPT 5 100 (ENDOMECHANICALS) ×2 IMPLANT
TROCAR XCEL 12X100 BLDLESS (ENDOMECHANICALS) ×2 IMPLANT

## 2021-11-10 NOTE — Anesthesia Procedure Notes (Signed)
Procedure Name: Intubation ?Date/Time: 11/10/2021 10:45 AM ?Performed by: Lavina Hamman, CRNA ?Pre-anesthesia Checklist: Patient identified, Emergency Drugs available, Suction available, Patient being monitored and Timeout performed ?Patient Re-evaluated:Patient Re-evaluated prior to induction ?Oxygen Delivery Method: Circle system utilized ?Preoxygenation: Pre-oxygenation with 100% oxygen ?Induction Type: IV induction, Rapid sequence and Cricoid Pressure applied ?Laryngoscope Size: Mac and 4 ?Grade View: Grade I ?Tube type: Oral ?Tube size: 7.5 mm ?Number of attempts: 1 ?Airway Equipment and Method: Stylet ?Placement Confirmation: ETT inserted through vocal cords under direct vision, positive ETCO2, CO2 detector and breath sounds checked- equal and bilateral ?Secured at: 23 cm ?Tube secured with: Tape ?Dental Injury: Teeth and Oropharynx as per pre-operative assessment  ?Comments: ATOI ? ? ? ? ?

## 2021-11-10 NOTE — Interval H&P Note (Signed)
History and Physical Interval Note: ? ?11/10/2021 ?9:48 AM ? ?Richard Doyle  has presented today for surgery, with the diagnosis of CHOLECYSTITIS.  The various methods of treatment have been discussed with the patient and family. After consideration of risks, benefits and other options for treatment, the patient has consented to  Procedure(s): ?LAPAROSCOPIC CHOLECYSTECTOMY (N/A) as a surgical intervention.  The patient's history has been reviewed, patient examined, no change in status, stable for surgery.  I have reviewed the patient's chart and labs.  Questions were answered to the patient's satisfaction.   ? ? ?Richard Doyle ? ? ?

## 2021-11-10 NOTE — Anesthesia Preprocedure Evaluation (Addendum)
Anesthesia Evaluation  ?Patient identified by MRN, date of birth, ID band ?Patient awake ? ? ? ?Reviewed: ?Allergy & Precautions, NPO status , Patient's Chart, lab work & pertinent test results, reviewed documented beta blocker date and time  ? ?History of Anesthesia Complications ?Negative for: history of anesthetic complications ? ?Airway ?Mallampati: II ? ?TM Distance: >3 FB ?Neck ROM: Full ? ? ? Dental ? ?(+) Dental Advisory Given, Caps ?  ?Pulmonary ?sleep apnea and Continuous Positive Airway Pressure Ventilation , COPD,  COPD inhaler, former smoker,  ?  ?breath sounds clear to auscultation ? ? ? ? ? ? Cardiovascular ?hypertension, Pt. on medications and Pt. on home beta blockers ?(-) angina+CHF (entresto)  ? ?Rhythm:Regular Rate:Normal ? ?10/28/2021 ECHO: ? ?1. The left ventricle is mildly dilated in size with moderately increased  ?wall thickness.  ??2. The left ventricular systolic function is mildly decreased, LVEF is  ?visually estimated at 40-45%.  ??3. There is mild mitral valve regurgitation.  ??4. The left atrium is moderately dilated in size.  ??5. The right ventricle is normal in size, with normal systolic function.   ? ?  ?Neuro/Psych ?negative neurological ROS ?   ? GI/Hepatic ?Neg liver ROS, GERD  Poorly Controlled,N/v with gallbladder ?  ?Endo/Other  ?diabetes (glu 162), Insulin Dependent, Oral Hypoglycemic AgentsMorbid obesity ? Renal/GU ?negative Renal ROS  ? ?  ?Musculoskeletal ? ? Abdominal ?(+) + obese,   ?Peds ? Hematology ?negative hematology ROS ?(+)   ?Anesthesia Other Findings ? ? Reproductive/Obstetrics ? ?  ? ? ? ? ? ? ? ? ? ? ? ? ? ?  ?  ? ? ? ? ? ? ? ?Anesthesia Physical ?Anesthesia Plan ? ?ASA: 3 ? ?Anesthesia Plan: General  ? ?Post-op Pain Management: Tylenol PO (pre-op)*  ? ?Induction: Intravenous and Rapid sequence ? ?PONV Risk Score and Plan: 2 and Ondansetron and Dexamethasone ? ?Airway Management Planned: Oral ETT ? ?Additional Equipment:  None ? ?Intra-op Plan:  ? ?Post-operative Plan: Extubation in OR ? ?Informed Consent: I have reviewed the patients History and Physical, chart, labs and discussed the procedure including the risks, benefits and alternatives for the proposed anesthesia with the patient or authorized representative who has indicated his/her understanding and acceptance.  ? ? ? ?Dental advisory given ? ?Plan Discussed with: CRNA and Surgeon ? ?Anesthesia Plan Comments:   ? ? ? ? ?Anesthesia Quick Evaluation ? ?

## 2021-11-10 NOTE — Anesthesia Postprocedure Evaluation (Signed)
Anesthesia Post Note ? ?Patient: Rathana Viveros ? ?Procedure(s) Performed: LAPAROSCOPIC CHOLECYSTECTOMY ? ?  ? ?Patient location during evaluation: PACU ?Anesthesia Type: General ?Level of consciousness: awake and alert ?Pain management: pain level controlled ?Vital Signs Assessment: post-procedure vital signs reviewed and stable ?Respiratory status: spontaneous breathing, nonlabored ventilation, respiratory function stable and patient connected to nasal cannula oxygen ?Cardiovascular status: blood pressure returned to baseline and stable ?Postop Assessment: no apparent nausea or vomiting ?Anesthetic complications: no ? ? ?No notable events documented. ? ?Last Vitals:  ?Vitals:  ? 11/10/21 1230 11/10/21 1245  ?BP: (!) 142/86 (!) 145/82  ?Pulse: 69 71  ?Resp: 10 (!) 8  ?Temp: 36.9 ?C   ?SpO2: 95% 95%  ?  ?Last Pain:  ?Vitals:  ? 11/10/21 1230  ?TempSrc:   ?PainSc: 4   ? ? ?  ?  ?  ?  ?  ?  ? ?Nelle Don Channel Papandrea ? ? ? ? ?

## 2021-11-10 NOTE — Transfer of Care (Signed)
Immediate Anesthesia Transfer of Care Note ? ?Patient: Richard Doyle ? ?Procedure(s) Performed: Procedure(s): ?LAPAROSCOPIC CHOLECYSTECTOMY (N/A) ? ?Patient Location: PACU ? ?Anesthesia Type:General ? ?Level of Consciousness:  sedated, patient cooperative and responds to stimulation ? ?Airway & Oxygen Therapy:Patient Spontanous Breathing and Patient connected to face mask oxgen ? ?Post-op Assessment:  Report given to PACU RN and Post -op Vital signs reviewed and stable ? ?Post vital signs:  Reviewed and stable ? ?Last Vitals:  ?Vitals:  ? 11/10/21 0845  ?BP: (!) 162/101  ?Pulse: 77  ?Resp: 18  ?Temp: 36.6 ?C  ?SpO2: 100%  ? ? ?Complications: No apparent anesthesia complications ? ?

## 2021-11-10 NOTE — Op Note (Signed)
Operative Note ? ?Richard Doyle 46 y.o. male ?[id number]  ?11/10/2021 ? ?Surgeon: Berna Bue MD FACS ? ?Procedure performed: Laparoscopic Cholecystectomy ? ?Preop diagnosis: Chronic cholecystitis ?Post-op diagnosis/intraop findings: same ? ?Specimens: gallbladder ? ?Retained items: none ? ?EBL: minimal ? ?Complications: none ? ?Description of procedure: After obtaining informed consent the patient was brought to the operating room. Antibiotics were administered. SCD's were applied. General endotracheal anesthesia was initiated and a formal time-out was performed. The abdomen was prepped and draped in the usual sterile fashion and the abdomen was entered using an infraumbilical veress needle after instilling the site with local. Insufflation to was obtained, 69mm trocar and camera inserted, and gross inspection revealed no evidence of injury from our entry or other intraabdominal abnormalities. Two 53mm trocars were introduced in the right midclavicular and right anterior axillary lines under direct visualization and following infiltration with local. An 28mm trocar was placed in the epigastrium. The gallbladder was retracted cephalad and the infundibulum was retracted laterally.  Omental adhesions to the gallbladder were taken down bluntly and with cautery were indicated, taking care to protect the nearby duodenum.  A combination of hook electrocautery and blunt dissection was utilized to clear the peritoneum from the neck and cystic duct, circumferentially isolating the cystic artery and cystic duct and lifting the gallbladder from the cystic plate. The critical view of safety was achieved with the cystic artery, cystic duct, and liver bed visualized between them with no other structures.  The cystic duct was short but on palpation did not contain any debris or stones.  The artery was clipped with 2 clips proximally and one distally and divided as was the cystic duct with three clips on the proximal end.  The gallbladder was dissected from the liver plate using electrocautery. Once freed the gallbladder was placed in an endocatch bag and removed intact through the epigastric trocar site.  Hemostasis was once again confirmed, and reinspection of the abdomen revealed no injuries. The clips were well opposed without any bile leak from the duct or the liver bed. The 64mm trocar site in the epigastrium was closed with a 0 vicryl in the fascia under direct visualization using a PMI device. The abdomen was desufflated and all trocars removed. The skin incisions were closed with running subcuticular monocryl and Dermabond. The patient was awakened, extubated and transported to the recovery room in stable condition.  ?  ?All counts were correct at the completion of the case. ? ? ?  ?

## 2021-11-10 NOTE — Discharge Instructions (Addendum)
LAPAROSCOPIC SURGERY: POST OP INSTRUCTIONS ? ? ?EAT ?Gradually transition to a high fiber diet with a fiber supplement over the next few weeks after discharge.  Start with a pureed / full liquid diet (see below) ? ?WALK ?Walk an hour a day (cumulative, not all at once).  Control your pain to do that.   ? ?CONTROL PAIN ?Control pain so that you can walk, sleep, tolerate sneezing/coughing, go up/down stairs. ? ?HAVE A BOWEL MOVEMENT DAILY ?Keep your bowels regular to avoid problems.  OK to try a laxative to override constipation.  OK to use an antidairrheal to slow down diarrhea.  Call if not better after 2 tries ? ?CALL IF YOU HAVE PROBLEMS/CONCERNS ?Call if you are still struggling despite following these instructions. ?Call if you have concerns not answered by these instructions ? ? ? ?DIET: Follow a light bland diet & liquids the first 24 hours after arrival home, such as soup, liquids, starches, etc.  Be sure to drink plenty of fluids.  Quickly advance to a usual solid diet within a few days.  Avoid fast food or heavy meals as your are more likely to get nauseated or have irregular bowels.  A low-sugar, high-fiber diet for the rest of your life is ideal. ? ?Take your usually prescribed home medications unless otherwise directed.  Okay to resume aspirin the day after surgery. ? ?PAIN CONTROL: ?Pain is best controlled by a usual combination of three different methods TOGETHER: ?Ice/Heat ?Over the counter pain medication ?Prescription pain medication ?Most patients will experience some swelling and bruising around the incisions.  Ice packs or heating pads (30-60 minutes up to 6 times a day) will help. Use ice for the first few days to help decrease swelling and bruising, then switch to heat to help relax tight/sore spots and speed recovery.  Some people prefer to use ice alone, heat alone, alternating between ice & heat.  Experiment to what works for you.  Swelling and bruising can take several weeks to resolve.    ?It is helpful to take an over-the-counter pain medication regularly for the first few days: ?Naproxen (Aleve, etc)  Two 220mg  tabs twice a day OR Ibuprofen (Advil, etc) Three 200mg  tabs four times a day (every meal & bedtime) AND ?Acetaminophen (Tylenol, etc) 500-650mg  four times a day (every meal & bedtime) ?A  prescription for pain medication (such as oxycodone, hydrocodone, tramadol, gabapentin, methocarbamol, etc) should be given to you upon discharge.  Take your pain medication as prescribed, IF NEEDED.  ?If you are having problems/concerns with the prescription medicine (does not control pain, nausea, vomiting, rash, itching, etc), please call [phone] to see if we need to switch you to a different pain medicine that will work better for you and/or control your side effect better. ?If you need a refill on your pain medication, please give [place] 48 hour notice.  contact your pharmacy.  They will contact our office to request authorization. Prescriptions will not be filled after 5 pm or on week-ends ? ?Avoid getting constipated.   ?Between the surgery and the pain medications, it is common to experience some constipation.   ?Increasing fluid intake and taking a fiber supplement (such as Metamucil, Citrucel, FiberCon, MiraLax, etc) 1-2 times a day regularly will usually help prevent this problem from occurring.   ?A mild laxative (prune juice, Milk of Magnesia, MiraLax, etc) should be taken according to package directions if there are no bowel movements after 48 hours.   ?Watch out  for diarrhea.   ?If you have many loose bowel movements, simplify your diet to bland foods & liquids for a few days.   ?Stop any stool softeners and decrease your fiber supplement.   ?Switching to mild anti-diarrheal medications (Kayopectate, Pepto Bismol) can help.   ?If this worsens or does not improve, please call us. ? ?Wash / shower every day.  You may shower over the skin glue which is waterproof ? ?Glue will flake off  after about 2 weeks.  You may leave the incision open to air.  You may replace a dressing/Band-Aid to cover the incision for comfort if you wish.  ? ?ACTIVITIES as tolerated:   ?You may resume regular (light) daily activities beginning the next day--such as daily self-care, walking, climbing stairs--gradually increasing activities as tolerated.  If you can walk 30 minutes without difficulty, it is safe to try more intense activity such as jogging, treadmill, bicycling, low-impact aerobics, swimming, etc. ?Save the most intensive and strenuous activity for last such as sit-ups, heavy lifting, contact sports, etc  Refrain from any heavy lifting or straining until you are off narcotics for pain control.   ?DO NOT PUSH THROUGH PAIN.  Let pain be your guide: If it hurts to do something, don't do it.  Pain is your body warning you to avoid that activity for another week until the pain goes down. ?You may drive when you are no longer taking prescription pain medication, you can comfortably wear a seatbelt, and you can safely maneuver your car and apply brakes. ?You may have sexual intercourse when it is comfortable. ? ?FOLLOW UP in our office ?Please call [facility] at [phone] to set up an appointment to see your surgeon in the office for a follow-up appointment approximately 2-3 weeks after your surgery. ?Make sure that you call for this appointment the day you arrive home to insure a convenient appointment time. ? ?10. IF YOU HAVE DISABILITY OR FAMILY LEAVE FORMS, BRING THEM TO THE OFFICE FOR PROCESSING.  DO NOT GIVE THEM TO YOUR DOCTOR. ? ? ?WHEN TO CALL us [phone]: ?Poor pain control ?Reactions / problems with new medications (rash/itching, nausea, etc)  ?Fever over 101.5 F (38.5 C) ?Inability to urinate ?Nausea and/or vomiting ?Worsening swelling or bruising ?Continued bleeding from incision. ?Increased pain, redness, or drainage from the incision ? ? The clinic staff is available to answer your questions  during regular business hours (8:30am-5pm).  Please don?t hesitate to call and ask to speak to one of our nurses for clinical concerns.  ? If you have a medical emergency, go to the nearest emergency room or call 911. ? A surgeon from [facility] is always on call at the hospitals ? ? ?[facility], [place] ?[address], [address], [place], [place]  [address] ? ?MAIN: [phone] ? TOLL FREE: [phone] ?  ?FAX [phone] ?[email or web address] ? ?

## 2021-11-11 ENCOUNTER — Encounter (HOSPITAL_COMMUNITY): Payer: Self-pay | Admitting: Surgery

## 2021-11-11 LAB — SURGICAL PATHOLOGY

## 2021-11-11 LAB — TYPE AND SCREEN
ABO/RH(D): A POS
Antibody Screen: NEGATIVE

## 2021-12-09 DIAGNOSIS — E1165 Type 2 diabetes mellitus with hyperglycemia: Secondary | ICD-10-CM | POA: Diagnosis not present

## 2021-12-09 DIAGNOSIS — I2510 Atherosclerotic heart disease of native coronary artery without angina pectoris: Secondary | ICD-10-CM | POA: Diagnosis not present

## 2021-12-09 DIAGNOSIS — E785 Hyperlipidemia, unspecified: Secondary | ICD-10-CM | POA: Diagnosis not present

## 2021-12-09 DIAGNOSIS — I429 Cardiomyopathy, unspecified: Secondary | ICD-10-CM | POA: Diagnosis not present

## 2021-12-10 DIAGNOSIS — G4733 Obstructive sleep apnea (adult) (pediatric): Secondary | ICD-10-CM | POA: Diagnosis not present

## 2022-01-18 DIAGNOSIS — I429 Cardiomyopathy, unspecified: Secondary | ICD-10-CM | POA: Diagnosis not present

## 2022-01-18 DIAGNOSIS — E7849 Other hyperlipidemia: Secondary | ICD-10-CM | POA: Diagnosis not present

## 2022-01-18 DIAGNOSIS — I2510 Atherosclerotic heart disease of native coronary artery without angina pectoris: Secondary | ICD-10-CM | POA: Diagnosis not present

## 2022-01-18 DIAGNOSIS — E8881 Metabolic syndrome: Secondary | ICD-10-CM | POA: Diagnosis not present

## 2022-01-18 DIAGNOSIS — K219 Gastro-esophageal reflux disease without esophagitis: Secondary | ICD-10-CM | POA: Diagnosis not present

## 2022-01-18 DIAGNOSIS — E785 Hyperlipidemia, unspecified: Secondary | ICD-10-CM | POA: Diagnosis not present

## 2022-01-18 DIAGNOSIS — E1165 Type 2 diabetes mellitus with hyperglycemia: Secondary | ICD-10-CM | POA: Diagnosis not present

## 2022-02-14 DIAGNOSIS — I10 Essential (primary) hypertension: Secondary | ICD-10-CM | POA: Diagnosis not present

## 2022-02-14 DIAGNOSIS — I2510 Atherosclerotic heart disease of native coronary artery without angina pectoris: Secondary | ICD-10-CM | POA: Diagnosis not present

## 2022-02-14 DIAGNOSIS — E785 Hyperlipidemia, unspecified: Secondary | ICD-10-CM | POA: Diagnosis not present

## 2022-02-14 DIAGNOSIS — I5022 Chronic systolic (congestive) heart failure: Secondary | ICD-10-CM | POA: Diagnosis not present

## 2022-04-19 DIAGNOSIS — I10 Essential (primary) hypertension: Secondary | ICD-10-CM | POA: Diagnosis not present

## 2022-04-19 DIAGNOSIS — I2510 Atherosclerotic heart disease of native coronary artery without angina pectoris: Secondary | ICD-10-CM | POA: Diagnosis not present

## 2022-04-19 DIAGNOSIS — E1165 Type 2 diabetes mellitus with hyperglycemia: Secondary | ICD-10-CM | POA: Diagnosis not present

## 2022-04-19 DIAGNOSIS — E785 Hyperlipidemia, unspecified: Secondary | ICD-10-CM | POA: Diagnosis not present

## 2022-04-22 DIAGNOSIS — I428 Other cardiomyopathies: Secondary | ICD-10-CM | POA: Diagnosis not present

## 2022-04-22 DIAGNOSIS — E1169 Type 2 diabetes mellitus with other specified complication: Secondary | ICD-10-CM | POA: Diagnosis not present

## 2022-04-22 DIAGNOSIS — I5020 Unspecified systolic (congestive) heart failure: Secondary | ICD-10-CM | POA: Diagnosis not present

## 2022-04-22 DIAGNOSIS — G4733 Obstructive sleep apnea (adult) (pediatric): Secondary | ICD-10-CM | POA: Diagnosis not present

## 2022-05-17 DIAGNOSIS — I10 Essential (primary) hypertension: Secondary | ICD-10-CM | POA: Diagnosis not present

## 2022-05-17 DIAGNOSIS — E785 Hyperlipidemia, unspecified: Secondary | ICD-10-CM | POA: Diagnosis not present

## 2022-05-17 DIAGNOSIS — I2510 Atherosclerotic heart disease of native coronary artery without angina pectoris: Secondary | ICD-10-CM | POA: Diagnosis not present

## 2022-05-17 DIAGNOSIS — I5022 Chronic systolic (congestive) heart failure: Secondary | ICD-10-CM | POA: Diagnosis not present

## 2022-09-23 DIAGNOSIS — H33323 Round hole, bilateral: Secondary | ICD-10-CM | POA: Diagnosis not present

## 2022-10-21 DIAGNOSIS — E1165 Type 2 diabetes mellitus with hyperglycemia: Secondary | ICD-10-CM | POA: Diagnosis not present

## 2022-10-21 DIAGNOSIS — H33323 Round hole, bilateral: Secondary | ICD-10-CM | POA: Diagnosis not present

## 2022-10-21 DIAGNOSIS — I10 Essential (primary) hypertension: Secondary | ICD-10-CM | POA: Diagnosis not present

## 2022-10-21 DIAGNOSIS — E785 Hyperlipidemia, unspecified: Secondary | ICD-10-CM | POA: Diagnosis not present

## 2022-10-27 DIAGNOSIS — E1165 Type 2 diabetes mellitus with hyperglycemia: Secondary | ICD-10-CM | POA: Diagnosis not present

## 2022-11-17 DIAGNOSIS — I10 Essential (primary) hypertension: Secondary | ICD-10-CM | POA: Diagnosis not present

## 2022-11-17 DIAGNOSIS — I5022 Chronic systolic (congestive) heart failure: Secondary | ICD-10-CM | POA: Diagnosis not present

## 2022-11-17 DIAGNOSIS — I2510 Atherosclerotic heart disease of native coronary artery without angina pectoris: Secondary | ICD-10-CM | POA: Diagnosis not present

## 2022-11-17 DIAGNOSIS — E785 Hyperlipidemia, unspecified: Secondary | ICD-10-CM | POA: Diagnosis not present

## 2022-12-01 DIAGNOSIS — I517 Cardiomegaly: Secondary | ICD-10-CM | POA: Diagnosis not present

## 2022-12-01 DIAGNOSIS — I5022 Chronic systolic (congestive) heart failure: Secondary | ICD-10-CM | POA: Diagnosis not present

## 2022-12-01 DIAGNOSIS — R931 Abnormal findings on diagnostic imaging of heart and coronary circulation: Secondary | ICD-10-CM | POA: Diagnosis not present

## 2022-12-01 DIAGNOSIS — I340 Nonrheumatic mitral (valve) insufficiency: Secondary | ICD-10-CM | POA: Diagnosis not present

## 2022-12-01 DIAGNOSIS — I5189 Other ill-defined heart diseases: Secondary | ICD-10-CM | POA: Diagnosis not present

## 2022-12-18 ENCOUNTER — Emergency Department (HOSPITAL_BASED_OUTPATIENT_CLINIC_OR_DEPARTMENT_OTHER)
Admission: EM | Admit: 2022-12-18 | Discharge: 2022-12-18 | Disposition: A | Discharge status: Home / Self Care | Payer: BC Managed Care – PPO | Attending: Emergency Medicine | Admitting: Emergency Medicine

## 2022-12-18 ENCOUNTER — Encounter (HOSPITAL_BASED_OUTPATIENT_CLINIC_OR_DEPARTMENT_OTHER): Payer: Self-pay | Admitting: *Deleted

## 2022-12-18 DIAGNOSIS — I2510 Atherosclerotic heart disease of native coronary artery without angina pectoris: Secondary | ICD-10-CM | POA: Diagnosis not present

## 2022-12-18 DIAGNOSIS — I509 Heart failure, unspecified: Secondary | ICD-10-CM | POA: Insufficient documentation

## 2022-12-18 DIAGNOSIS — Z79899 Other long term (current) drug therapy: Secondary | ICD-10-CM | POA: Diagnosis not present

## 2022-12-18 DIAGNOSIS — J45909 Unspecified asthma, uncomplicated: Secondary | ICD-10-CM | POA: Insufficient documentation

## 2022-12-18 DIAGNOSIS — E119 Type 2 diabetes mellitus without complications: Secondary | ICD-10-CM | POA: Diagnosis not present

## 2022-12-18 DIAGNOSIS — T7840XA Allergy, unspecified, initial encounter: Secondary | ICD-10-CM

## 2022-12-18 DIAGNOSIS — Z794 Long term (current) use of insulin: Secondary | ICD-10-CM | POA: Diagnosis not present

## 2022-12-18 DIAGNOSIS — I110 Hypertensive heart disease with heart failure: Secondary | ICD-10-CM | POA: Insufficient documentation

## 2022-12-18 DIAGNOSIS — Z7984 Long term (current) use of oral hypoglycemic drugs: Secondary | ICD-10-CM | POA: Diagnosis not present

## 2022-12-18 DIAGNOSIS — Z7951 Long term (current) use of inhaled steroids: Secondary | ICD-10-CM | POA: Insufficient documentation

## 2022-12-18 MED ORDER — PREDNISONE 10 MG PO TABS: 20.0000 mg | ORAL_TABLET | Freq: Every day | ORAL | 0 refills | Status: AC

## 2022-12-18 MED ORDER — FAMOTIDINE IN NACL 20-0.9 MG/50ML-% IV SOLN
20.0000 mg | Freq: Once | INTRAVENOUS | Status: AC
  Administered 2022-12-18: 20 mg via INTRAVENOUS
  Filled 2022-12-18: qty 50

## 2022-12-18 MED ORDER — EPINEPHRINE 0.3 MG/0.3ML IJ SOAJ: 0.3000 mg | INTRAMUSCULAR | 0 refills | Status: AC | PRN

## 2022-12-18 MED ORDER — METHYLPREDNISOLONE SODIUM SUCC 125 MG IJ SOLR
125.0000 mg | INTRAMUSCULAR | Status: AC
  Administered 2022-12-18: 125 mg via INTRAVENOUS
  Filled 2022-12-18: qty 2

## 2022-12-18 MED ORDER — ONDANSETRON HCL 4 MG/2ML IJ SOLN
4.0000 mg | Freq: Once | INTRAMUSCULAR | Status: AC
  Administered 2022-12-18: 4 mg via INTRAVENOUS
  Filled 2022-12-18: qty 2

## 2022-12-18 NOTE — ED Notes (Signed)
Dc instructions reviewed with patient. Patient voiced understanding. Dc with belongings.  °

## 2022-12-18 NOTE — ED Triage Notes (Addendum)
Pt arrives POV from home due to allergic reaction.  Pt states that he woke up with rash all over body and swelling of his lips and tongue and itching and burning.  Pt took 50mg  benadryl pta. Pt reports "a little sob"

## 2022-12-18 NOTE — ED Provider Notes (Signed)
[place] EMERGENCY DEPARTMENT AT [facility] Provider Note   CSN: [id number] Arrival date & time: 12/18/22  4098     History {Add pertinent medical, surgical, social history, OB history to HPI:1} Chief Complaint  Patient presents with  . Allergic Reaction    Javontay Vandam is a 47 y.o. male.  HPI     47yo male with history of nonischemic cardiomyopathy, nonobstructive coronary artery disease, hypertension, hyperlipidemia type 2 diabetes, OSA presents with concern for allergic reaction.   Woke up this AM with rash all over the body, swelling tingling of lips, itching burning of lips and tongue.  Took 50mg  benadrly PTA.  Felt a little dyspnea at the time. Had 2 episodes of emesis  Received steroids, zofran, pepcid by [facility] provider.  Now feels like his symptoms are improving. Has continued itching, tingling of lower lip.    Past Medical History:  Diagnosis Date  . Arthritis   . Asthma   . Congestive heart failure (CHF) (HCC)   . Diabetes mellitus without complication (HCC)   . GERD (gastroesophageal reflux disease)   . Hypertension   . Mixed hyperlipidemia   . Sleep apnea      Home Medications Prior to Admission medications   Medication Sig Start Date End Date Taking? Authorizing Provider  fluticasone (FLONASE) 50 MCG/ACT nasal spray Place 2 sprays into both nostrils daily. 09/30/21   [provider]  furosemide (LASIX) 20 MG tablet Take 40 mg by mouth daily as needed for fluid. 09/28/21   [provider]  insulin glargine (LANTUS SOLOSTAR) 100 UNIT/ML Solostar Pen Inject 15 Units into the skin in the morning. 12/18/19   [provider]  insulin lispro (HUMALOG KWIKPEN) 200 UNIT/ML KwikPen Inject 15-20 Units into the skin 3 (three) times daily after meals. Sliding scale 07/21/20   [provider]  JARDIANCE 25 MG TABS tablet Take 25 mg by mouth daily. 05/08/21   [provider]  metFORMIN (GLUCOPHAGE-XR) 500 MG 24 hr tablet  Take 1,000 mg by mouth 2 (two) times daily with a meal. 07/22/20   [provider]  metoprolol succinate (TOPROL-XL) 50 MG 24 hr tablet Take 50 mg by mouth 2 (two) times daily. 09/28/21   [provider]  sacubitril-valsartan (ENTRESTO) 97-103 MG Take 1 tablet by mouth 2 (two) times daily. 07/04/19   [provider]  Semaglutide, 1 MG/DOSE, (OZEMPIC, 1 MG/DOSE,) 4 MG/3ML SOPN Inject 1 mg into the skin once a week. Sunday    [provider]  spironolactone (ALDACTONE) 25 MG tablet Take 25 mg by mouth daily. 09/28/21   [provider]      Allergies    Patient has no known allergies.    Review of Systems   Review of Systems  Physical Exam Updated Vital Signs BP (!) 196/122   Pulse 92   Resp 18   SpO2 99%  Physical Exam  ED Results / Procedures / Treatments   Labs (all labs ordered are listed, but only abnormal results are displayed) Labs Reviewed - No data to display  EKG EKG Interpretation  Date/Time:  Sunday December 18 2022 06:24:28 EDT Ventricular Rate:  89 PR Interval:  202 QRS Duration: 150 QT Interval:  430 QTC Calculation: 523 R Axis:   -66 Text Interpretation: Sinus rhythm with occasional Premature ventricular complexes Left axis deviation Left bundle branch block   Confirmed by Palumbo, April ([id number]) on 12/18/2022 6:44:06 AM  Radiology No results found.  Procedures Procedures  {Document cardiac  monitor, telemetry assessment procedure when appropriate:1}  Medications Ordered in ED Medications  famotidine (PEPCID) IVPB 20 mg premix (20 mg Intravenous New Bag/Given 12/18/22 0658)  methylPREDNISolone sodium succinate (SOLU-MEDROL) 125 mg/2 mL injection 125 mg (125 mg Intravenous Given 12/18/22 0655)  ondansetron (ZOFRAN) injection 4 mg (4 mg Intravenous Given 12/18/22 0705)    ED Course/ Medical Decision Making/ A&P   {   Click here for ABCD2, HEART and other calculatorsREFRESH Note before signing :1}                           Medical Decision Making Risk Prescription drug management.   ***  {Document critical care time when appropriate:1} {Document review of labs and clinical decision tools ie heart score, Chads2Vasc2 etc:1}  {Document your independent review of radiology images, and any outside records:1} {Document your discussion with family members, caretakers, and with consultants:1} {Document social determinants of health affecting pt's care:1} {Document your decision making why or why not admission, treatments were needed:1} Final Clinical Impression(s) / ED Diagnoses Final diagnoses:  None    Rx / DC Orders ED Discharge Orders     None

## 2022-12-18 NOTE — Discharge Instructions (Addendum)
Recommend benadryl (diphenydramine) 25mg  every 6 hours for 48 hours and famotidine 20mg  once daily in addition to steroids.

## 2022-12-18 NOTE — ED Provider Triage Note (Signed)
Emergency Medicine Provider Triage Evaluation Note  Kamarius Buckbee , a 47 y.o. male  was evaluated in triage.  Pt complains of allergic reaction  Review of Systems  Positive: Hives  Negative: vomiting   Physical Exam  There were no vitals taken for this visit. Gen:   Awake, no distress  Resp:  Normal effort MSK:   Moves extremities without difficulty  Other: Hives   Medical Decision Making  Medically screening exam initiated at 6:43 AM.  Appropriate orders placed.  Calvyn Kurtzman was informed that the remainder of the evaluation will be completed by another provider, this initial triage assessment does not replace that evaluation, and the importance of remaining in the ED until their evaluation is complete.     Wesleigh Markovic, MD 12/18/22 [phone]

## 2023-01-02 DIAGNOSIS — G4733 Obstructive sleep apnea (adult) (pediatric): Secondary | ICD-10-CM | POA: Diagnosis not present

## 2023-03-27 DIAGNOSIS — E1165 Type 2 diabetes mellitus with hyperglycemia: Secondary | ICD-10-CM | POA: Diagnosis not present

## 2023-03-27 DIAGNOSIS — Z794 Long term (current) use of insulin: Secondary | ICD-10-CM | POA: Diagnosis not present

## 2023-03-27 DIAGNOSIS — I10 Essential (primary) hypertension: Secondary | ICD-10-CM | POA: Diagnosis not present

## 2023-03-27 DIAGNOSIS — E785 Hyperlipidemia, unspecified: Secondary | ICD-10-CM | POA: Diagnosis not present

## 2023-04-03 DIAGNOSIS — B029 Zoster without complications: Secondary | ICD-10-CM | POA: Diagnosis not present

## 2023-04-03 DIAGNOSIS — Z6841 Body Mass Index (BMI) 40.0 and over, adult: Secondary | ICD-10-CM | POA: Diagnosis not present

## 2023-05-05 DIAGNOSIS — M1712 Unilateral primary osteoarthritis, left knee: Secondary | ICD-10-CM | POA: Diagnosis not present

## 2023-05-05 DIAGNOSIS — M25562 Pain in left knee: Secondary | ICD-10-CM | POA: Diagnosis not present

## 2023-05-05 DIAGNOSIS — M1711 Unilateral primary osteoarthritis, right knee: Secondary | ICD-10-CM | POA: Diagnosis not present

## 2023-06-12 DIAGNOSIS — M1712 Unilateral primary osteoarthritis, left knee: Secondary | ICD-10-CM | POA: Diagnosis not present

## 2023-06-19 DIAGNOSIS — M1712 Unilateral primary osteoarthritis, left knee: Secondary | ICD-10-CM | POA: Diagnosis not present

## 2023-06-22 DIAGNOSIS — I10 Essential (primary) hypertension: Secondary | ICD-10-CM | POA: Diagnosis not present

## 2023-06-22 DIAGNOSIS — I5022 Chronic systolic (congestive) heart failure: Secondary | ICD-10-CM | POA: Diagnosis not present

## 2023-06-22 DIAGNOSIS — E785 Hyperlipidemia, unspecified: Secondary | ICD-10-CM | POA: Diagnosis not present

## 2023-06-22 DIAGNOSIS — I2510 Atherosclerotic heart disease of native coronary artery without angina pectoris: Secondary | ICD-10-CM | POA: Diagnosis not present

## 2023-06-26 DIAGNOSIS — M1712 Unilateral primary osteoarthritis, left knee: Secondary | ICD-10-CM | POA: Diagnosis not present

## 2023-07-07 DIAGNOSIS — G4733 Obstructive sleep apnea (adult) (pediatric): Secondary | ICD-10-CM | POA: Diagnosis not present

## 2023-07-27 DIAGNOSIS — E1165 Type 2 diabetes mellitus with hyperglycemia: Secondary | ICD-10-CM | POA: Diagnosis not present

## 2023-08-08 DIAGNOSIS — E785 Hyperlipidemia, unspecified: Secondary | ICD-10-CM | POA: Diagnosis not present

## 2023-08-08 DIAGNOSIS — I10 Essential (primary) hypertension: Secondary | ICD-10-CM | POA: Diagnosis not present

## 2023-08-08 DIAGNOSIS — E1165 Type 2 diabetes mellitus with hyperglycemia: Secondary | ICD-10-CM | POA: Diagnosis not present

## 2023-08-08 DIAGNOSIS — I429 Cardiomyopathy, unspecified: Secondary | ICD-10-CM | POA: Diagnosis not present

## 2023-09-03 IMAGING — MR MR ABDOMEN WO/W CM MRCP
21 of 25 series · 41 of 48 positions shown · IV contrast (10 GADAVIST)
Comparison: CT and ultrasound examinations, same date.

CLINICAL DATA: Jaundice.

EXAM:
MRI ABDOMEN WITHOUT AND WITH CONTRAST (INCLUDING MRCP)
TECHNIQUE: Multiplanar multisequence MR imaging of the abdomen was performed
both before and after the administration of intravenous contrast.
Heavily T2-weighted images of the biliary and pancreatic ducts were
obtained, and three-dimensional MRCP images were rendered by post
processing.
CONTRAST:  10mL GADAVIST GADOBUTROL 1 MMOL/ML IV SOLN

[Series 3: T2 fat-sat · 1 of 5 slices shown (1 of 4)]
[im 1/5]
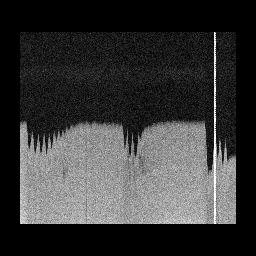

[Series 4: T2 fat-sat · axial · 6.0mm · 1.44mm/px · 1 of 15 slices shown (2 of 4)]
[im 1/15]
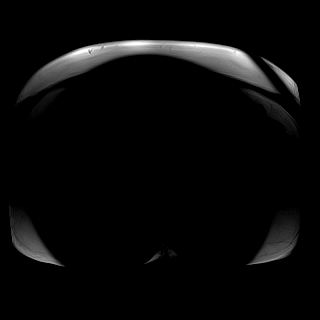

[Series 5: DWI · axial · 6.0mm · 1.72mm/px · 1 of 80 slices shown (1 of 2)]
[im 1/80]
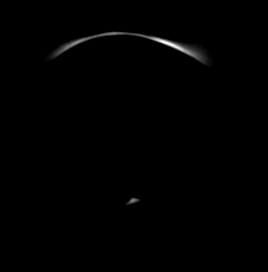

[Series 6: DWI · axial · 6.0mm · 1.72mm/px · 1 of 40 slices shown (2 of 2)]
[im 1/40]
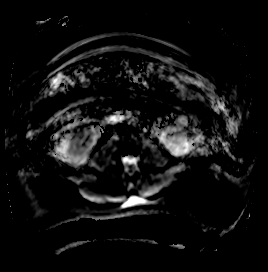

[Series 8: T2 fat-sat · axial · 6.0mm · 1.44mm/px · 1 of 4 slices shown (3 of 4)]
[im 1/4]
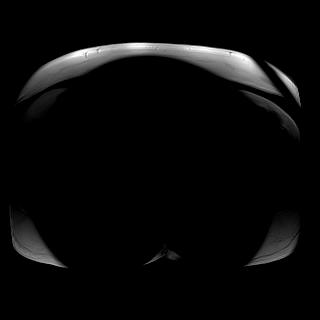

[Series 9: T2 · coronal · 6.0mm · 1.72mm/px · 1 of 36 slices shown (1 of 2)]
[im 1/36]
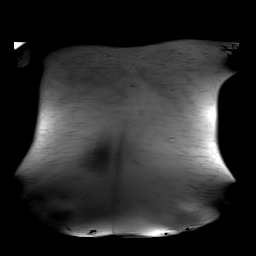

[Series 10: T2 · axial · 6.0mm · 1.80mm/px · 1 of 40 slices shown (2 of 2)]
[im 1/40]
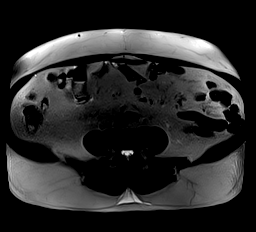

[Series 11: T2 fat-sat · axial · 6.0mm · 1.95mm/px · 1 of 40 slices shown (4 of 4)]
[im 1/40]
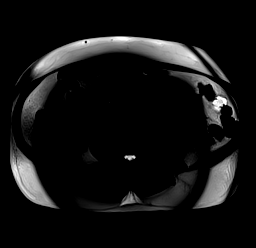

[Series 12: T1 · axial · 3.2mm · 1.44mm/px · z∈[-142,+110]mm · 2 of 80 slices shown (1 of 2)]
[im 1/80]
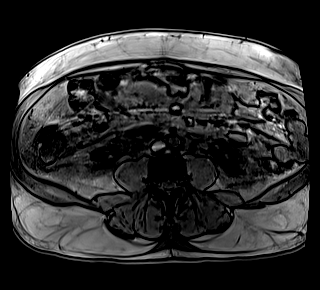
[im 80/80]
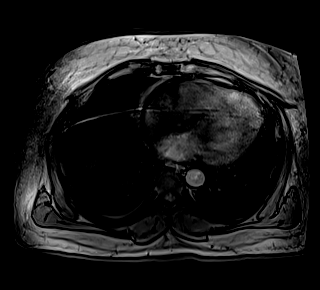

[Series 13: T1 · axial · 3.2mm · 1.44mm/px · z∈[-142,+110]mm · 2 of 80 slices shown (2 of 2)]
[im 1/80]
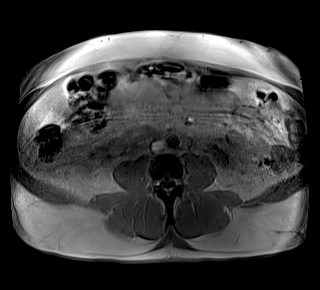
[im 80/80]
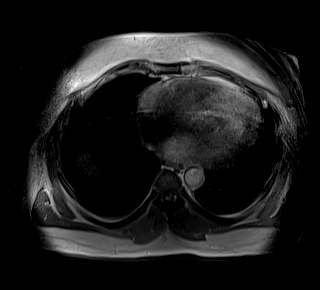

[Series 14: cor_3d_spc_trig · coronal · 1.0mm · 0.60mm/px · 2 of 72 slices shown]
[im 1/72]
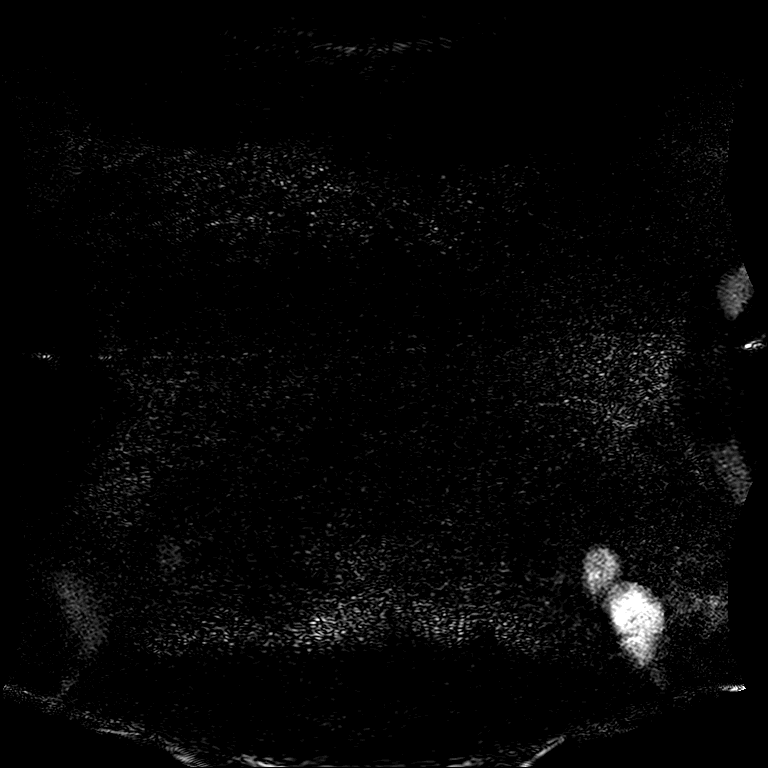
[im 72/72]
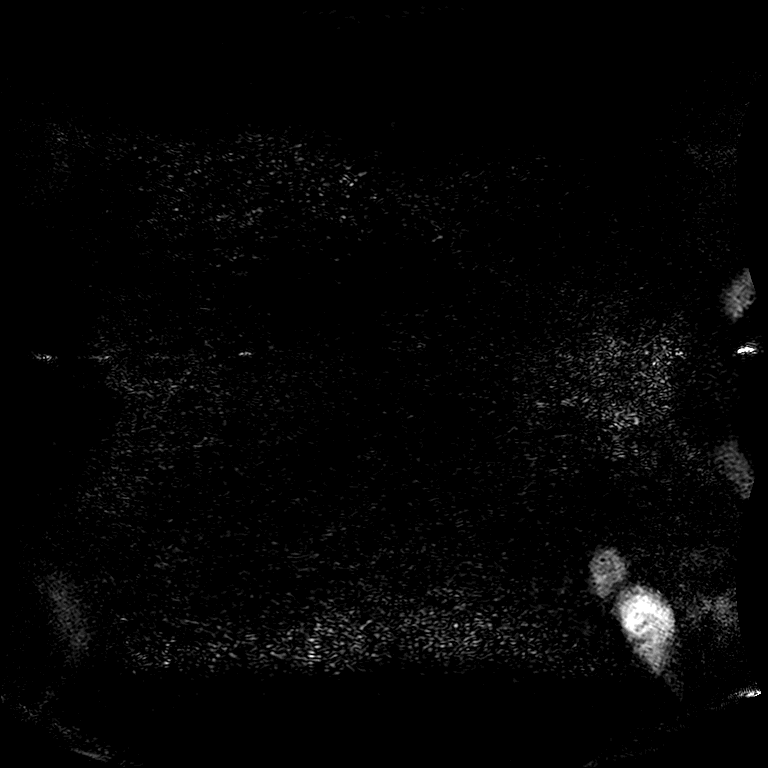

[Series 18: T1 dynamic · axial · 3.0mm · 1.56mm/px · z∈[-140,+145]mm · 3 of 96 slices shown (1 of 9)]
[im 1/96]
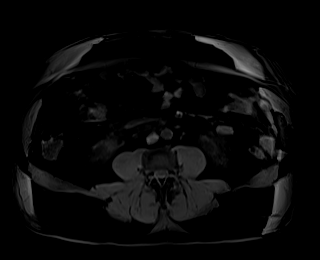
[im 48/96]
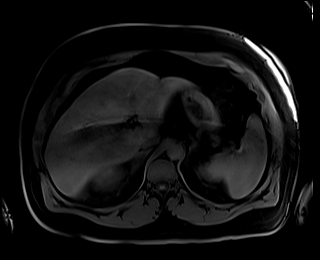
[im 96/96]
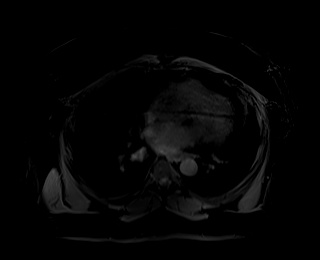

[Series 19: cor obl thk · sagittal · 50.0mm · 0.78mm/px · 1 of 9 slices shown]
[im 1/9]
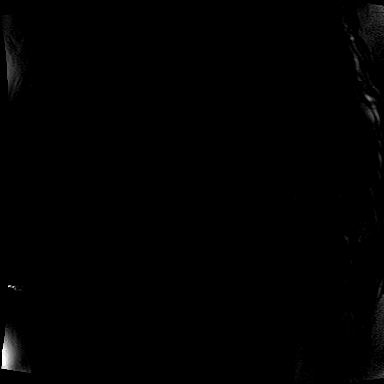

[Series 23: T1 dynamic · axial · 3.0mm · 1.56mm/px · z∈[-140,+145]mm · 3 of 96 slices shown (2 of 9)]
[im 1/96]
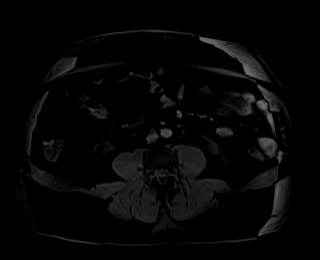
[im 48/96]
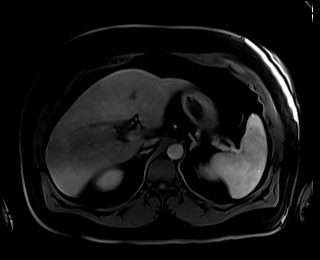
[im 96/96]
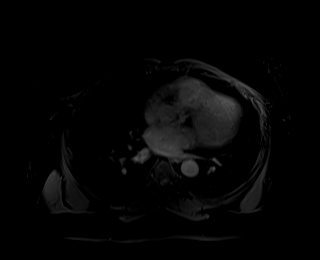

[Series 24: T1 dynamic · axial · 3.0mm · 1.56mm/px · z∈[-140,+145]mm · 3 of 96 slices shown (3 of 9)]
[im 1/96]
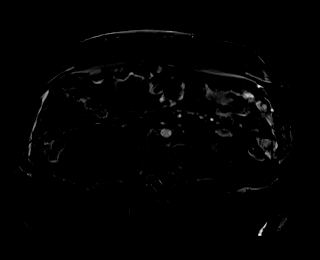
[im 48/96]
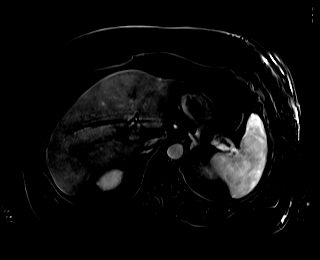
[im 96/96]
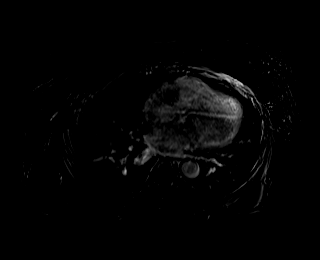

[Series 28: T1 dynamic · axial · 3.0mm · 1.56mm/px · z∈[-140,+145]mm · 3 of 96 slices shown (4 of 9)]
[im 1/96]
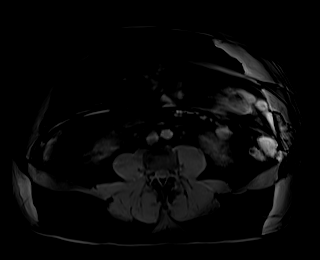
[im 48/96]
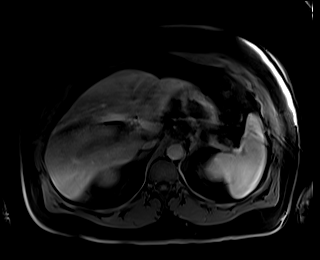
[im 96/96]
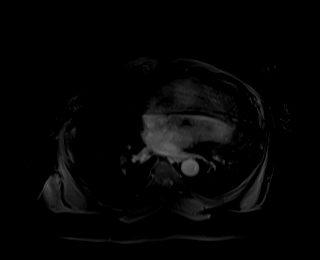

[Series 29: T1 dynamic · axial · 3.0mm · 1.56mm/px · z∈[-140,+145]mm · 3 of 96 slices shown (5 of 9)]
[im 1/96]
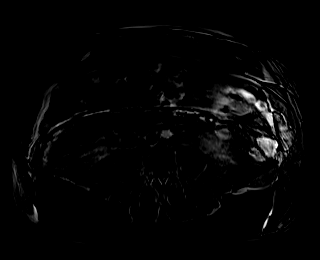
[im 48/96]
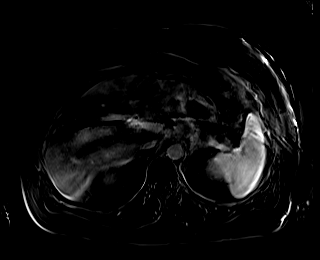
[im 96/96]
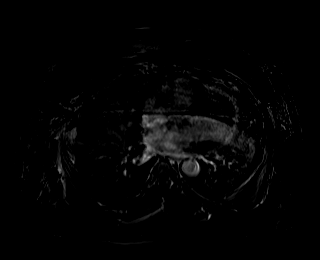

[Series 32: T1 dynamic · axial · 3.0mm · 1.56mm/px · z∈[-140,+145]mm · 3 of 96 slices shown (6 of 9)]
[im 1/96]
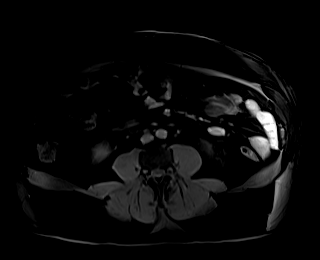
[im 48/96]
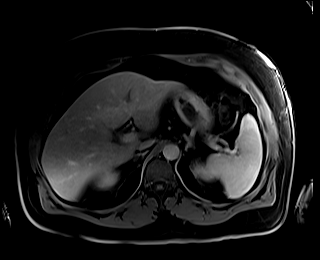
[im 96/96]
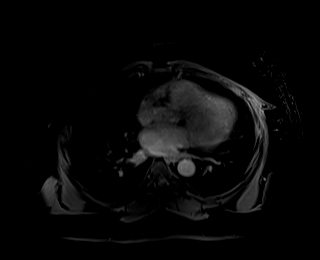

[Series 33: T1 dynamic · axial · 3.0mm · 1.56mm/px · z∈[-140,+145]mm · 3 of 96 slices shown (7 of 9)]
[im 1/96]
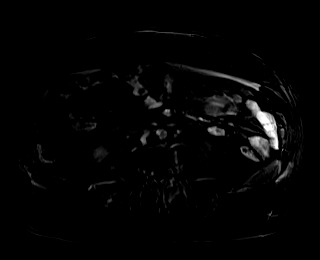
[im 48/96]
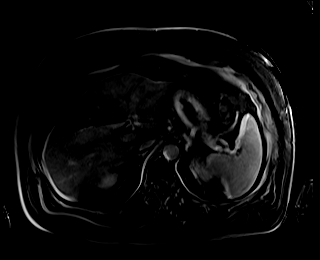
[im 96/96]
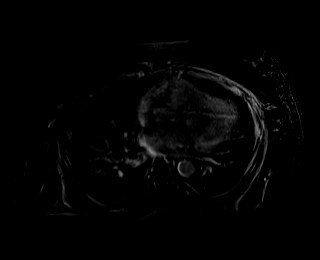

[Series 35: T1 dynamic · coronal · 3.0mm · 1.41mm/px · 2 of 80 slices shown (8 of 9)]
[im 1/80]
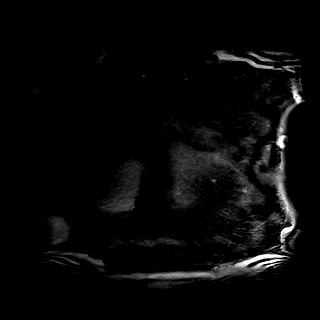
[im 80/80]
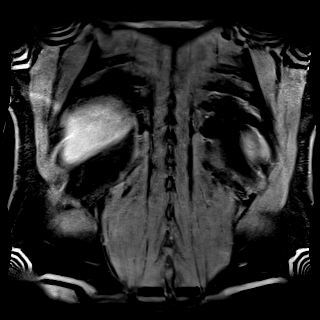

[Series 38: T1 dynamic · axial · 3.0mm · 1.56mm/px · z∈[-140,+145]mm · 3 of 96 slices shown (9 of 9)]
[im 1/96]
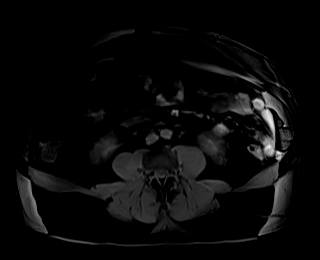
[im 48/96]
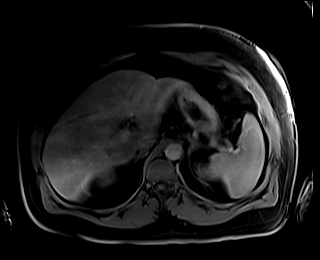
[im 96/96]
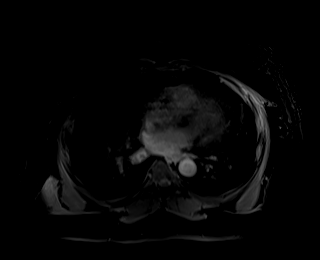

[41 of 48 positions shown; findings below may reference images not displayed]

FINDINGS: Examination is limited due to breathing motion artifact.

Lower chest: The lung bases are grossly clear. No pleural or
pericardial effusion.

Hepatobiliary: Heterogeneous areas of loss of signal intensity on
the out of phase gradient echo T1 weighted sequence suggesting fatty
infiltration. No worrisome hepatic lesions. No intrahepatic biliary
dilatation. Normal caliber and course of the common bile duct. No
common bile duct stones are identified. There is gallbladder wall
thickening, gallbladder wall enhancement and pericholecystic fluid
without definite gallstones. Findings could suggest a calculus
cholecystitis.

Pancreas:  No mass, inflammation or ductal dilatation.

Spleen:  Normal size.  No focal lesions.

Adrenals/Urinary Tract: The adrenal glands and kidneys are
unremarkable. Small renal cysts are noted. No worrisome renal
lesions or hydronephrosis.

Stomach/Bowel: The stomach, duodenum, visualized small bowel and
visualized colon are unremarkable.

Vascular/Lymphatic: The aorta and branch vessels are patent. The
major venous structures are patent. No mesenteric or retroperitoneal
mass or adenopathy.

Other:  No ascites or abdominal wall hernia.

Musculoskeletal: No significant bony findings.
IMPRESSION: 1. Gallbladder wall thickening and enhancement and pericholecystic
fluid could suggest acalculus cholecystitis.
2. Normal caliber and course of the common bile duct. No common bile
duct stones.
3. Fatty infiltration of the liver but no focal hepatic lesions or
intrahepatic biliary dilatation.
4. No abdominal mass or adenopathy.

## 2023-10-05 DIAGNOSIS — G4733 Obstructive sleep apnea (adult) (pediatric): Secondary | ICD-10-CM | POA: Diagnosis not present

## 2023-11-14 DIAGNOSIS — E785 Hyperlipidemia, unspecified: Secondary | ICD-10-CM | POA: Diagnosis not present

## 2023-11-14 DIAGNOSIS — E1165 Type 2 diabetes mellitus with hyperglycemia: Secondary | ICD-10-CM | POA: Diagnosis not present

## 2023-11-14 DIAGNOSIS — I10 Essential (primary) hypertension: Secondary | ICD-10-CM | POA: Diagnosis not present

## 2023-11-14 DIAGNOSIS — Z794 Long term (current) use of insulin: Secondary | ICD-10-CM | POA: Diagnosis not present

## 2023-12-15 DIAGNOSIS — E119 Type 2 diabetes mellitus without complications: Secondary | ICD-10-CM | POA: Diagnosis not present

## 2023-12-15 DIAGNOSIS — I10 Essential (primary) hypertension: Secondary | ICD-10-CM | POA: Diagnosis not present

## 2023-12-15 DIAGNOSIS — I5022 Chronic systolic (congestive) heart failure: Secondary | ICD-10-CM | POA: Diagnosis not present

## 2023-12-15 DIAGNOSIS — I2510 Atherosclerotic heart disease of native coronary artery without angina pectoris: Secondary | ICD-10-CM | POA: Diagnosis not present

## 2024-01-16 DIAGNOSIS — E1165 Type 2 diabetes mellitus with hyperglycemia: Secondary | ICD-10-CM | POA: Diagnosis not present

## 2024-01-19 DIAGNOSIS — R051 Acute cough: Secondary | ICD-10-CM | POA: Diagnosis not present

## 2024-01-19 DIAGNOSIS — J4521 Mild intermittent asthma with (acute) exacerbation: Secondary | ICD-10-CM | POA: Diagnosis not present

## 2024-01-19 DIAGNOSIS — Z6841 Body Mass Index (BMI) 40.0 and over, adult: Secondary | ICD-10-CM | POA: Diagnosis not present

## 2024-02-13 DIAGNOSIS — E785 Hyperlipidemia, unspecified: Secondary | ICD-10-CM | POA: Diagnosis not present

## 2024-02-13 DIAGNOSIS — Z794 Long term (current) use of insulin: Secondary | ICD-10-CM | POA: Diagnosis not present

## 2024-02-13 DIAGNOSIS — I10 Essential (primary) hypertension: Secondary | ICD-10-CM | POA: Diagnosis not present

## 2024-02-13 DIAGNOSIS — E1165 Type 2 diabetes mellitus with hyperglycemia: Secondary | ICD-10-CM | POA: Diagnosis not present

## 2024-03-21 DIAGNOSIS — I10 Essential (primary) hypertension: Secondary | ICD-10-CM | POA: Diagnosis not present

## 2024-03-21 DIAGNOSIS — E1165 Type 2 diabetes mellitus with hyperglycemia: Secondary | ICD-10-CM | POA: Diagnosis not present

## 2024-03-21 DIAGNOSIS — E785 Hyperlipidemia, unspecified: Secondary | ICD-10-CM | POA: Diagnosis not present

## 2024-03-21 DIAGNOSIS — Z794 Long term (current) use of insulin: Secondary | ICD-10-CM | POA: Diagnosis not present

## 2024-03-28 DIAGNOSIS — G4733 Obstructive sleep apnea (adult) (pediatric): Secondary | ICD-10-CM | POA: Diagnosis not present

## 2024-04-05 ENCOUNTER — Other Ambulatory Visit: Payer: Self-pay | Admitting: Medical Genetics

## 2024-06-11 ENCOUNTER — Other Ambulatory Visit: Payer: Self-pay | Admitting: Medical Genetics

## 2024-06-11 DIAGNOSIS — Z006 Encounter for examination for normal comparison and control in clinical research program: Secondary | ICD-10-CM

## 2024-06-21 DIAGNOSIS — I428 Other cardiomyopathies: Secondary | ICD-10-CM | POA: Diagnosis not present

## 2024-06-21 DIAGNOSIS — I10 Essential (primary) hypertension: Secondary | ICD-10-CM | POA: Diagnosis not present

## 2024-06-21 DIAGNOSIS — I2510 Atherosclerotic heart disease of native coronary artery without angina pectoris: Secondary | ICD-10-CM | POA: Diagnosis not present

## 2024-06-26 DIAGNOSIS — G4733 Obstructive sleep apnea (adult) (pediatric): Secondary | ICD-10-CM | POA: Diagnosis not present

## 2024-07-05 DIAGNOSIS — I5189 Other ill-defined heart diseases: Secondary | ICD-10-CM | POA: Diagnosis not present

## 2024-07-05 DIAGNOSIS — I428 Other cardiomyopathies: Secondary | ICD-10-CM | POA: Diagnosis not present

## 2024-07-05 DIAGNOSIS — I517 Cardiomegaly: Secondary | ICD-10-CM | POA: Diagnosis not present

## 2024-07-15 LAB — GENECONNECT MOLECULAR SCREEN: Genetic Analysis Overall Interpretation: NEGATIVE

## 2024-07-20 DIAGNOSIS — M25512 Pain in left shoulder: Secondary | ICD-10-CM | POA: Diagnosis not present

## 2024-07-26 DIAGNOSIS — M25512 Pain in left shoulder: Secondary | ICD-10-CM | POA: Diagnosis not present

## 2024-07-26 DIAGNOSIS — E1165 Type 2 diabetes mellitus with hyperglycemia: Secondary | ICD-10-CM | POA: Diagnosis not present

## 2024-07-26 DIAGNOSIS — E7849 Other hyperlipidemia: Secondary | ICD-10-CM | POA: Diagnosis not present

## 2024-07-31 DIAGNOSIS — I2510 Atherosclerotic heart disease of native coronary artery without angina pectoris: Secondary | ICD-10-CM | POA: Diagnosis not present

## 2024-07-31 DIAGNOSIS — Z6841 Body Mass Index (BMI) 40.0 and over, adult: Secondary | ICD-10-CM | POA: Diagnosis not present

## 2024-07-31 DIAGNOSIS — E1165 Type 2 diabetes mellitus with hyperglycemia: Secondary | ICD-10-CM | POA: Diagnosis not present

## 2024-07-31 DIAGNOSIS — I10 Essential (primary) hypertension: Secondary | ICD-10-CM | POA: Diagnosis not present

## 2024-07-31 DIAGNOSIS — Z23 Encounter for immunization: Secondary | ICD-10-CM | POA: Diagnosis not present

## 2024-08-13 DIAGNOSIS — M6281 Muscle weakness (generalized): Secondary | ICD-10-CM | POA: Diagnosis not present

## 2024-08-13 DIAGNOSIS — M25512 Pain in left shoulder: Secondary | ICD-10-CM | POA: Diagnosis not present

## 2024-08-13 DIAGNOSIS — M25612 Stiffness of left shoulder, not elsewhere classified: Secondary | ICD-10-CM | POA: Diagnosis not present

## 2024-09-25 ENCOUNTER — Other Ambulatory Visit: Payer: Self-pay

## 2024-09-25 ENCOUNTER — Emergency Department (HOSPITAL_BASED_OUTPATIENT_CLINIC_OR_DEPARTMENT_OTHER)
Admission: EM | Admit: 2024-09-25 | Discharge: 2024-09-26 | Disposition: A | Source: Home / Self Care | Attending: Emergency Medicine | Admitting: Emergency Medicine

## 2024-09-25 ENCOUNTER — Emergency Department (HOSPITAL_BASED_OUTPATIENT_CLINIC_OR_DEPARTMENT_OTHER)

## 2024-09-25 ENCOUNTER — Encounter (HOSPITAL_BASED_OUTPATIENT_CLINIC_OR_DEPARTMENT_OTHER): Payer: Self-pay | Admitting: *Deleted

## 2024-09-25 DIAGNOSIS — N23 Unspecified renal colic: Secondary | ICD-10-CM

## 2024-09-25 LAB — COMPREHENSIVE METABOLIC PANEL WITH GFR
ALT: 50 U/L — ABNORMAL HIGH (ref 0–44)
AST: 21 U/L (ref 15–41)
Albumin: 4.5 g/dL (ref 3.5–5.0)
Alkaline Phosphatase: 97 U/L (ref 38–126)
Anion gap: 16 — ABNORMAL HIGH (ref 5–15)
BUN: 17 mg/dL (ref 6–20)
CO2: 24 mmol/L (ref 22–32)
Calcium: 10 mg/dL (ref 8.9–10.3)
Chloride: 101 mmol/L (ref 98–111)
Creatinine, Ser: 1.39 mg/dL — ABNORMAL HIGH (ref 0.61–1.24)
GFR, Estimated: >60 mL/min
Glucose, Bld: 160 mg/dL — ABNORMAL HIGH (ref 70–99)
Potassium: 3.4 mmol/L — ABNORMAL LOW (ref 3.5–5.1)
Sodium: 141 mmol/L (ref 135–145)
Total Bilirubin: 0.6 mg/dL (ref 0.0–1.2)
Total Protein: 7.3 g/dL (ref 6.5–8.1)

## 2024-09-25 LAB — CBC
HCT: 46.8 % (ref 39.0–52.0)
Hemoglobin: 16.3 g/dL (ref 13.0–17.0)
MCH: 30.6 pg (ref 26.0–34.0)
MCHC: 34.8 g/dL (ref 30.0–36.0)
MCV: 87.8 fL (ref 80.0–100.0)
Platelets: 303 10*3/uL (ref 150–400)
RBC: 5.33 MIL/uL (ref 4.22–5.81)
RDW: 14 % (ref 11.5–15.5)
WBC: 11.5 10*3/uL — ABNORMAL HIGH (ref 4.0–10.5)
nRBC: 0 % (ref 0.0–0.2)

## 2024-09-25 LAB — LIPASE, BLOOD: Lipase: 29 U/L (ref 11–51)

## 2024-09-25 MED ORDER — ONDANSETRON HCL 4 MG/2ML IJ SOLN
4.0000 mg | Freq: Once | INTRAMUSCULAR | Status: AC
  Administered 2024-09-25: 4 mg via INTRAVENOUS
  Filled 2024-09-25: qty 2

## 2024-09-25 MED ORDER — MORPHINE SULFATE (PF) 4 MG/ML IV SOLN
4.0000 mg | Freq: Once | INTRAVENOUS | Status: AC
  Administered 2024-09-25: 4 mg via INTRAVENOUS
  Filled 2024-09-25: qty 1

## 2024-09-25 NOTE — ED Provider Notes (Signed)
 " [place] EMERGENCY DEPARTMENT AT [facility] Provider Note   CSN: [id number] Arrival date & time: 09/25/24  2200     Patient presents with: Abdominal Pain and Flank Pain   Mesiah Manzo is a 49 y.o. male.   Patient is a 49 year old male with a history of CHF, diabetes, hypertension, hyperlipidemia who is presenting today with sudden onset of abdominal pain.  Pain started approximately 2 hours ago when he was doing nothing in particular.  The pain is in the left side and radiates into the left flank.  He reports that he initially felt like he needed to have a bowel movement but has not been able to go much and then feels like he needs to urinate continuously but only gets a little dribble out.  He has had nausea but no significant vomiting.  Reports otherwise today was a normal day.  Has never had anything like this before.  Tried to take some ibuprofen but reports the pain has not improved.  Denies history of alcohol use.  Did have to have a cholecystectomy several years ago.  The history is provided by the patient, the spouse and medical records.  Abdominal Pain Flank Pain Associated symptoms include abdominal pain.       Prior to Admission medications  Medication Sig Start Date End Date Taking? Authorizing Provider  EPINEPHrine  0.3 mg/0.3 mL IJ SOAJ injection Inject 0.3 mg into the muscle as needed for anaphylaxis. 12/18/22   Dreama Longs, MD  fluticasone (FLONASE) 50 MCG/ACT nasal spray Place 2 sprays into both nostrils daily. 09/30/21   [provider]  furosemide  (LASIX ) 20 MG tablet Take 40 mg by mouth daily as needed for fluid. 09/28/21   [provider]  insulin  glargine (LANTUS  SOLOSTAR) 100 UNIT/ML Solostar Pen Inject 15 Units into the skin in the morning. 12/18/19   [provider]  insulin  lispro (HUMALOG KWIKPEN) 200 UNIT/ML KwikPen Inject 15-20 Units into the skin 3 (three) times daily after meals. Sliding scale 07/21/20   [provider]  JARDIANCE 25 MG TABS tablet Take 25 mg by mouth daily. 05/08/21   [provider]  metFORMIN (GLUCOPHAGE-XR) 500 MG 24 hr tablet Take 1,000 mg by mouth 2 (two) times daily with a meal. 07/22/20   [provider]  metoprolol  succinate (TOPROL -XL) 50 MG 24 hr tablet Take 50 mg by mouth 2 (two) times daily. 09/28/21   [provider]  sacubitril -valsartan  (ENTRESTO ) 97-103 MG Take 1 tablet by mouth 2 (two) times daily. 07/04/19   [provider]  Semaglutide, 1 MG/DOSE, (OZEMPIC, 1 MG/DOSE,) 4 MG/3ML SOPN Inject 1 mg into the skin once a week. Sunday    [provider]  spironolactone  (ALDACTONE ) 25 MG tablet Take 25 mg by mouth daily. 09/28/21   [provider]    Allergies: Patient has no known allergies.    Review of Systems  Gastrointestinal:  Positive for abdominal pain.  Genitourinary:  Positive for flank pain.    Updated Vital Signs BP (!) 160/97   Pulse 86   Temp 98.7 F (37.1 C) (Oral)   Resp 16   SpO2 (!) 83%   Physical Exam Vitals and nursing note reviewed.  Constitutional:      General: He is not in acute distress.    Appearance: He is well-developed.  HENT:     Head: Normocephalic and atraumatic.  Eyes:     Conjunctiva/sclera: Conjunctivae normal.     Pupils: Pupils are equal, round, and  reactive to light.  Cardiovascular:     Rate and Rhythm: Normal rate and regular rhythm.     Heart sounds: No murmur heard. Pulmonary:     Effort: Pulmonary effort is normal. No respiratory distress.     Breath sounds: Normal breath sounds. No wheezing or rales.  Abdominal:     General: There is no distension.     Palpations: Abdomen is soft.     Tenderness: There is abdominal tenderness in the left lower quadrant. There is left CVA tenderness. There is no guarding or rebound.  Musculoskeletal:        General: No tenderness. Normal range of motion.     Cervical back: Normal range of motion and neck supple.   Skin:    General: Skin is warm and dry.     Findings: No erythema or rash.  Neurological:     Mental Status: He is alert and oriented to person, place, and time.  Psychiatric:        Behavior: Behavior normal.     (all labs ordered are listed, but only abnormal results are displayed) Labs Reviewed  LIPASE, BLOOD  COMPREHENSIVE METABOLIC PANEL WITH GFR  CBC  URINALYSIS, ROUTINE W REFLEX MICROSCOPIC    EKG: None  Radiology: No results found.   Procedures   Medications Ordered in the ED  ondansetron  (ZOFRAN ) injection 4 mg (has no administration in time range)  morphine  (PF) 4 MG/ML injection 4 mg (has no administration in time range)                                    Medical Decision Making Amount and/or Complexity of Data Reviewed Labs: ordered. Radiology: ordered.  Risk Prescription drug management.   Pt with multiple medical problems and comorbidities and presenting today with a complaint that caries a high risk for morbidity and mortality.  Pt with symptoms consistent with kidney stone.  Denies infectious sx, or GI symptoms.  Low concern for diverticulitis and lower suspicion for dissection, AAA rupture based on hx and VS but does have risk factors.  No hx suggestive of GU source (discharge).  Will treat pain and ensure no infection with UA, CBC, CMP and will get stone study to further eval.       Final diagnoses:  None    ED Discharge Orders     None          Doretha Folks, MD 09/25/24 2308  "

## 2024-09-25 NOTE — ED Triage Notes (Signed)
 Pt to ED reporting sudden onset of left sided abd and flank pain 2 hours ago. Pt reporting difficulty urinating. Per patient, pain is similar to gallstone pain in the past.

## 2024-09-26 LAB — URINALYSIS, ROUTINE W REFLEX MICROSCOPIC
Bacteria, UA: NONE SEEN
Bilirubin Urine: NEGATIVE
Glucose, UA: >1000 mg/dL — AB
Ketones, ur: NEGATIVE mg/dL
Leukocytes,Ua: NEGATIVE
Nitrite: NEGATIVE
Protein, ur: 30 mg/dL — AB
Specific Gravity, Urine: 1.036 — ABNORMAL HIGH (ref 1.005–1.030)
pH: 6 (ref 5.0–8.0)

## 2024-09-26 MED ORDER — HYOSCYAMINE SULFATE SL 0.125 MG SL SUBL: 1.0000 | SUBLINGUAL_TABLET | Freq: Four times a day (QID) | SUBLINGUAL | 0 refills | Status: AC | PRN

## 2024-09-26 MED ORDER — KETOROLAC TROMETHAMINE 15 MG/ML IJ SOLN
15.0000 mg | Freq: Once | INTRAMUSCULAR | Status: AC
  Administered 2024-09-26: 15 mg via INTRAVENOUS
  Filled 2024-09-26: qty 1

## 2024-09-26 MED ORDER — KETOROLAC TROMETHAMINE 10 MG PO TABS: 10.0000 mg | ORAL_TABLET | Freq: Four times a day (QID) | ORAL | 0 refills | Status: AC | PRN

## 2024-09-26 MED ORDER — HYOSCYAMINE SULFATE 0.125 MG SL SUBL
0.2500 mg | SUBLINGUAL_TABLET | Freq: Once | SUBLINGUAL | Status: AC
  Administered 2024-09-26: 0.25 mg via SUBLINGUAL
  Filled 2024-09-26: qty 2

## 2024-09-26 MED ORDER — ONDANSETRON 4 MG PO TBDP: 4.0000 mg | ORAL_TABLET | Freq: Three times a day (TID) | ORAL | 0 refills | Status: AC | PRN

## 2024-09-26 NOTE — Discharge Instructions (Addendum)
" °  During the workup we noted incidental findings on your imaging that would require you to follow-up with your regular doctor for further evaluation/management:  -  New small anterior pericardial effusion.  -  Enlarged moderately steatotic liver with mild splenomegaly.  -  Umbilical fat hernia.   "

## 2024-09-26 NOTE — ED Provider Notes (Signed)
 I assumed care of this patient from previous provider.  Please see their note for further details of history, exam, and MDM.   Briefly patient is a 49 y.o. male who presented left flank pain.  Initially concerning for renal stones.  Currently awaiting CT scan and UA.  CT scan did reveal 2 mm distal ureteral stone causing mild hydronephrosis.   On reassessment, patient reports pain is significantly improved.  The patient appears reasonably screened and/or stabilized for discharge and I doubt any other medical condition or other [facility] requiring further screening, evaluation, or treatment in the ED at this time. I have discussed the findings, Dx and Tx plan with the patient/family who expressed understanding and agree(s) with the plan. Discharge instructions discussed at length. The patient/family was given strict return precautions who verbalized understanding of the instructions. No further questions at time of discharge.  Disposition: Discharge  Condition: Good  ED Discharge Orders          Ordered    Hyoscyamine  Sulfate SL (LEVSIN /SL) 0.125 MG SUBL  4 times daily PRN        09/26/24 0121    ketorolac  (TORADOL ) 10 MG tablet  Every 6 hours PRN        09/26/24 0121    ondansetron  (ZOFRAN -ODT) 4 MG disintegrating tablet  Every 8 hours PRN        09/26/24 0121              Follow Up: Lari Elspeth BRAVO, MD [address] [place] [address] [phone]  Call  to schedule an appointment for close follow up  Elisabeth Valli BIRCH, MD [address] [place] [address] [place] [address] [phone]  Call  to schedule an appointment for close follow up         Trine Raynell Moder, MD 09/26/24 [phone]
